# Patient Record
Sex: Female | Born: 1975 | Race: White | Hispanic: No | Marital: Single | Smoking: Never smoker
Health system: Southern US, Community
[De-identification: ages and names within clinical notes are randomized; demographics above are authoritative.]

## PROBLEM LIST (undated history)

## (undated) DIAGNOSIS — F70 Mild intellectual disabilities: Secondary | ICD-10-CM

## (undated) DIAGNOSIS — R413 Other amnesia: Secondary | ICD-10-CM

## (undated) DIAGNOSIS — I639 Cerebral infarction, unspecified: Secondary | ICD-10-CM

## (undated) DIAGNOSIS — E109 Type 1 diabetes mellitus without complications: Secondary | ICD-10-CM

## (undated) DIAGNOSIS — R4789 Other speech disturbances: Secondary | ICD-10-CM

---

## 2001-06-24 ENCOUNTER — Other Ambulatory Visit: Admission: RE | Admit: 2001-06-24 | Discharge: 2001-06-24 | Payer: Self-pay | Admitting: Obstetrics and Gynecology

## 2002-06-28 ENCOUNTER — Other Ambulatory Visit: Admission: RE | Admit: 2002-06-28 | Discharge: 2002-06-28 | Payer: Self-pay | Admitting: Obstetrics and Gynecology

## 2002-08-02 ENCOUNTER — Inpatient Hospital Stay (HOSPITAL_COMMUNITY): Admission: RE | Admit: 2002-08-02 | Discharge: 2002-08-04 | Payer: Self-pay | Admitting: Obstetrics and Gynecology

## 2003-05-27 HISTORY — PX: OTHER SURGICAL HISTORY: SHX169

## 2003-09-22 ENCOUNTER — Other Ambulatory Visit: Admission: RE | Admit: 2003-09-22 | Discharge: 2003-09-22 | Payer: Self-pay | Admitting: Obstetrics and Gynecology

## 2004-10-25 ENCOUNTER — Other Ambulatory Visit: Admission: RE | Admit: 2004-10-25 | Discharge: 2004-10-25 | Payer: Self-pay | Admitting: Obstetrics and Gynecology

## 2007-09-14 ENCOUNTER — Emergency Department (HOSPITAL_COMMUNITY): Admission: EM | Admit: 2007-09-14 | Discharge: 2007-09-14 | Payer: Self-pay | Admitting: Family Medicine

## 2011-02-18 LAB — POCT RAPID STREP A: Streptococcus, Group A Screen (Direct): NEGATIVE

## 2011-06-09 DIAGNOSIS — E049 Nontoxic goiter, unspecified: Secondary | ICD-10-CM | POA: Diagnosis not present

## 2011-06-09 DIAGNOSIS — E109 Type 1 diabetes mellitus without complications: Secondary | ICD-10-CM | POA: Diagnosis not present

## 2011-06-27 HISTORY — PX: IUD REMOVAL: SHX5392

## 2011-08-14 DIAGNOSIS — E109 Type 1 diabetes mellitus without complications: Secondary | ICD-10-CM | POA: Diagnosis not present

## 2011-08-14 DIAGNOSIS — H538 Other visual disturbances: Secondary | ICD-10-CM | POA: Diagnosis not present

## 2011-08-14 DIAGNOSIS — R51 Headache: Secondary | ICD-10-CM | POA: Diagnosis not present

## 2011-08-14 DIAGNOSIS — H5200 Hypermetropia, unspecified eye: Secondary | ICD-10-CM | POA: Diagnosis not present

## 2011-08-21 DIAGNOSIS — Z0000 Encounter for general adult medical examination without abnormal findings: Secondary | ICD-10-CM | POA: Diagnosis not present

## 2011-08-21 DIAGNOSIS — Z01419 Encounter for gynecological examination (general) (routine) without abnormal findings: Secondary | ICD-10-CM | POA: Diagnosis not present

## 2011-08-21 DIAGNOSIS — Z124 Encounter for screening for malignant neoplasm of cervix: Secondary | ICD-10-CM | POA: Diagnosis not present

## 2011-08-21 DIAGNOSIS — N760 Acute vaginitis: Secondary | ICD-10-CM | POA: Diagnosis not present

## 2011-08-21 DIAGNOSIS — Z30432 Encounter for removal of intrauterine contraceptive device: Secondary | ICD-10-CM | POA: Diagnosis not present

## 2011-09-01 DIAGNOSIS — IMO0001 Reserved for inherently not codable concepts without codable children: Secondary | ICD-10-CM | POA: Diagnosis not present

## 2011-09-01 DIAGNOSIS — R0602 Shortness of breath: Secondary | ICD-10-CM | POA: Diagnosis not present

## 2011-09-01 DIAGNOSIS — M2550 Pain in unspecified joint: Secondary | ICD-10-CM | POA: Diagnosis not present

## 2011-09-01 DIAGNOSIS — E109 Type 1 diabetes mellitus without complications: Secondary | ICD-10-CM | POA: Diagnosis not present

## 2011-09-15 DIAGNOSIS — N3000 Acute cystitis without hematuria: Secondary | ICD-10-CM | POA: Diagnosis not present

## 2011-09-15 DIAGNOSIS — N361 Urethral diverticulum: Secondary | ICD-10-CM | POA: Diagnosis not present

## 2011-09-16 ENCOUNTER — Other Ambulatory Visit: Payer: Self-pay | Admitting: Urology

## 2011-09-25 ENCOUNTER — Encounter (HOSPITAL_BASED_OUTPATIENT_CLINIC_OR_DEPARTMENT_OTHER): Payer: Self-pay | Admitting: *Deleted

## 2011-09-25 NOTE — Progress Notes (Addendum)
SPOKE W/ MOTHER, JO MARTIN, SHE IS LEGAL GUARDIAN.  NPO AFTER MN. ARRIVES AT 0915. NEEDS ISTAT AND URINE PREG.  MOTHER NEEDS TO COME BACK WITH PT DUE TO MILD MENTAL RETARDATION, UNABLE TO UNDERSTAND TO MUCH INFORMATION AT ONE TIME. COGNITIVELY AT 3RD GRADE LEVEL.  BUT, MOTHER STATES PT VERY SOCIAL AND INDEPENDENT.

## 2011-09-29 ENCOUNTER — Encounter (HOSPITAL_BASED_OUTPATIENT_CLINIC_OR_DEPARTMENT_OTHER): Payer: Self-pay | Admitting: Anesthesiology

## 2011-09-29 ENCOUNTER — Encounter (HOSPITAL_BASED_OUTPATIENT_CLINIC_OR_DEPARTMENT_OTHER): Payer: Self-pay | Admitting: *Deleted

## 2011-09-29 ENCOUNTER — Ambulatory Visit (HOSPITAL_BASED_OUTPATIENT_CLINIC_OR_DEPARTMENT_OTHER)
Admission: RE | Admit: 2011-09-29 | Discharge: 2011-09-29 | Disposition: A | Discharge status: Home / Self Care | Payer: Medicare Other | Source: Ambulatory Visit | Attending: Urology | Admitting: Urology

## 2011-09-29 ENCOUNTER — Ambulatory Visit (HOSPITAL_BASED_OUTPATIENT_CLINIC_OR_DEPARTMENT_OTHER): Payer: Medicare Other | Admitting: Anesthesiology

## 2011-09-29 ENCOUNTER — Encounter (HOSPITAL_BASED_OUTPATIENT_CLINIC_OR_DEPARTMENT_OTHER)
Admission: RE | Disposition: A | Discharge status: Home / Self Care | Payer: Self-pay | Source: Ambulatory Visit | Attending: Urology

## 2011-09-29 DIAGNOSIS — N368 Other specified disorders of urethra: Secondary | ICD-10-CM | POA: Diagnosis not present

## 2011-09-29 DIAGNOSIS — N361 Urethral diverticulum: Secondary | ICD-10-CM

## 2011-09-29 DIAGNOSIS — F70 Mild intellectual disabilities: Secondary | ICD-10-CM | POA: Insufficient documentation

## 2011-09-29 DIAGNOSIS — Z794 Long term (current) use of insulin: Secondary | ICD-10-CM | POA: Diagnosis not present

## 2011-09-29 DIAGNOSIS — E119 Type 2 diabetes mellitus without complications: Secondary | ICD-10-CM | POA: Diagnosis not present

## 2011-09-29 DIAGNOSIS — N898 Other specified noninflammatory disorders of vagina: Secondary | ICD-10-CM | POA: Insufficient documentation

## 2011-09-29 DIAGNOSIS — N809 Endometriosis, unspecified: Secondary | ICD-10-CM | POA: Diagnosis not present

## 2011-09-29 DIAGNOSIS — N398 Other specified disorders of urinary system: Secondary | ICD-10-CM | POA: Diagnosis not present

## 2011-09-29 HISTORY — DX: Type 1 diabetes mellitus without complications: E10.9

## 2011-09-29 HISTORY — DX: Other speech disturbances: R47.89

## 2011-09-29 HISTORY — PX: URETHRAL DIVERTICULECTOMY: SHX2618

## 2011-09-29 HISTORY — DX: Other amnesia: R41.3

## 2011-09-29 HISTORY — DX: Mild intellectual disabilities: F70

## 2011-09-29 HISTORY — DX: Cerebral infarction, unspecified: I63.9

## 2011-09-29 LAB — POCT PREGNANCY, URINE: Preg Test, Ur: NEGATIVE

## 2011-09-29 LAB — GLUCOSE, CAPILLARY: Glucose-Capillary: 119 mg/dL — ABNORMAL HIGH (ref 70–99)

## 2011-09-29 LAB — POCT I-STAT 4, (NA,K, GLUC, HGB,HCT)
Glucose, Bld: 80 mg/dL (ref 70–99)
HCT: 45 % (ref 36.0–46.0)
Hemoglobin: 15.3 g/dL — ABNORMAL HIGH (ref 12.0–15.0)
Potassium: 4.2 mEq/L (ref 3.5–5.1)
Sodium: 142 mEq/L (ref 135–145)

## 2011-09-29 SURGERY — EXCISION, DIVERTICULUM, URETHRA
Anesthesia: General | Site: Urethra | Wound class: Clean Contaminated

## 2011-09-29 MED ORDER — MIDAZOLAM HCL 5 MG/5ML IJ SOLN
INTRAMUSCULAR | Status: DC | PRN
  Administered 2011-09-29: 1 mg via INTRAVENOUS

## 2011-09-29 MED ORDER — LIDOCAINE HCL (CARDIAC) 20 MG/ML IV SOLN
INTRAVENOUS | Status: DC | PRN
  Administered 2011-09-29: 60 mg via INTRAVENOUS

## 2011-09-29 MED ORDER — BUPIVACAINE-EPINEPHRINE 0.5% -1:200000 IJ SOLN: INTRAMUSCULAR | Status: DC | PRN

## 2011-09-29 MED ORDER — PROPOFOL 10 MG/ML IV EMUL
INTRAVENOUS | Status: DC | PRN
  Administered 2011-09-29: 200 mg via INTRAVENOUS

## 2011-09-29 MED ORDER — STERILE WATER FOR IRRIGATION IR SOLN
Status: DC | PRN
  Administered 2011-09-29: 500 mL

## 2011-09-29 MED ORDER — HYDROCODONE-ACETAMINOPHEN 10-325 MG PO TABS: 1.0000 | ORAL_TABLET | ORAL | Status: AC | PRN

## 2011-09-29 MED ORDER — LACTATED RINGERS IV SOLN
INTRAVENOUS | Status: DC
  Administered 2011-09-29: 100 mL/h via INTRAVENOUS
  Administered 2011-09-29 (×2): via INTRAVENOUS

## 2011-09-29 MED ORDER — BUPIVACAINE-EPINEPHRINE 0.25% -1:200000 IJ SOLN
INTRAMUSCULAR | Status: DC | PRN
  Administered 2011-09-29: 7 mL

## 2011-09-29 MED ORDER — FENTANYL CITRATE 0.05 MG/ML IJ SOLN: 25.0000 ug | INTRAMUSCULAR | Status: DC | PRN

## 2011-09-29 MED ORDER — BACITRACIN-NEOMYCIN-POLYMYXIN 400-5-5000 EX OINT
TOPICAL_OINTMENT | CUTANEOUS | Status: DC | PRN
  Administered 2011-09-29: 1 via TOPICAL

## 2011-09-29 MED ORDER — ACETAMINOPHEN 10 MG/ML IV SOLN
INTRAVENOUS | Status: DC | PRN
  Administered 2011-09-29: 1000 mg via INTRAVENOUS

## 2011-09-29 MED ORDER — FENTANYL CITRATE 0.05 MG/ML IJ SOLN
INTRAMUSCULAR | Status: DC | PRN
  Administered 2011-09-29 (×4): 50 ug via INTRAVENOUS

## 2011-09-29 MED ORDER — MEPERIDINE HCL 25 MG/ML IJ SOLN: 6.2500 mg | INTRAMUSCULAR | Status: DC | PRN

## 2011-09-29 MED ORDER — CIPROFLOXACIN HCL 500 MG PO TABS: 500.0000 mg | ORAL_TABLET | Freq: Two times a day (BID) | ORAL | Status: AC

## 2011-09-29 MED ORDER — LACTATED RINGERS IV SOLN: INTRAVENOUS | Status: DC

## 2011-09-29 MED ORDER — PROMETHAZINE HCL 25 MG/ML IJ SOLN
6.2500 mg | INTRAMUSCULAR | Status: AC | PRN
  Administered 2011-09-29 (×2): 6.25 mg via INTRAVENOUS

## 2011-09-29 MED ORDER — CIPROFLOXACIN IN D5W 400 MG/200ML IV SOLN
400.0000 mg | INTRAVENOUS | Status: AC
  Administered 2011-09-29: 400 mg via INTRAVENOUS

## 2011-09-29 MED ORDER — ONDANSETRON HCL 4 MG/2ML IJ SOLN
INTRAMUSCULAR | Status: DC | PRN
  Administered 2011-09-29: 4 mg via INTRAVENOUS

## 2011-09-29 SURGICAL SUPPLY — 49 items
BAG DRN ANRFLXCHMBR STRAP LEK (BAG)
BAG URINE DRAINAGE (UROLOGICAL SUPPLIES) IMPLANT
BAG URINE LEG 19OZ MD ST LTX (BAG) IMPLANT
BLADE SURG 15 STRL LF DISP TIS (BLADE) ×1 IMPLANT
BLADE SURG 15 STRL SS (BLADE) ×2
BLADE SURG ROTATE 9660 (MISCELLANEOUS) ×1 IMPLANT
CATH FOLEY 2WAY SLVR  5CC 16FR (CATHETERS) ×1
CATH FOLEY 2WAY SLVR 5CC 16FR (CATHETERS) ×1 IMPLANT
CATH FOLEY 5CC 28FR (CATHETERS) IMPLANT
CLOTH BEACON ORANGE TIMEOUT ST (SAFETY) ×2 IMPLANT
COVER MAYO STAND STRL (DRAPES) ×1 IMPLANT
COVER TABLE BACK 60X90 (DRAPES) ×1 IMPLANT
DRAPE LG THREE QUARTER DISP (DRAPES) ×3 IMPLANT
DRAPE STERI URO 23X35 APER SZ5 (DRAPE) ×1 IMPLANT
ELECT REM PT RETURN 9FT ADLT (ELECTROSURGICAL) ×2
ELECTRODE REM PT RTRN 9FT ADLT (ELECTROSURGICAL) ×1 IMPLANT
GAUZE PACKING IODOFORM 2 (PACKING) ×1 IMPLANT
GAUZE SPONGE 4X4 16PLY XRAY LF (GAUZE/BANDAGES/DRESSINGS) ×1 IMPLANT
GLOVE BIO SURGEON STRL SZ8 (GLOVE) ×2 IMPLANT
GOWN PREVENTION PLUS LG XLONG (DISPOSABLE) ×2 IMPLANT
GOWN STRL REIN XL XLG (GOWN DISPOSABLE) ×2 IMPLANT
IV NS IRRIG 3000ML ARTHROMATIC (IV SOLUTION) IMPLANT
LEGGING LITHOTOMY PAIR STRL (DRAPES) ×1 IMPLANT
NEEDLE HYPO 22GX1.5 SAFETY (NEEDLE) ×1 IMPLANT
NS IRRIG 500ML POUR BTL (IV SOLUTION) IMPLANT
PACK BASIN DAY SURGERY FS (CUSTOM PROCEDURE TRAY) ×2 IMPLANT
PACK CYSTOSCOPY (CUSTOM PROCEDURE TRAY) ×1 IMPLANT
PENCIL BUTTON HOLSTER BLD 10FT (ELECTRODE) ×2 IMPLANT
PLUG CATH AND CAP STER (CATHETERS) ×2 IMPLANT
RETRACTOR LONRSTAR 16.6X16.6CM (MISCELLANEOUS) IMPLANT
RETRACTOR STAY HOOK 5MM (MISCELLANEOUS) ×1 IMPLANT
RETRACTOR STER APS 16.6X16.6CM (MISCELLANEOUS) ×2
SUCTION FRAZIER TIP 10 FR DISP (SUCTIONS) ×1 IMPLANT
SUT CHROMIC 4 0 RB 1X27 (SUTURE) ×2 IMPLANT
SUT ETHIBOND 0 (SUTURE) IMPLANT
SUT VIC AB 2-0 SH 27 (SUTURE) ×2
SUT VIC AB 2-0 SH 27XBRD (SUTURE) ×1 IMPLANT
SUT VIC AB 2-0 UR6 27 (SUTURE) ×2 IMPLANT
SUT VIC AB 3-0 SH 27 (SUTURE) ×2
SUT VIC AB 3-0 SH 27X BRD (SUTURE) ×1 IMPLANT
SUT VICRYL 3 0 UR 6 27 (SUTURE) ×2 IMPLANT
SWAB CULTURE LIQ STUART DBL (MISCELLANEOUS) ×1 IMPLANT
SYR BULB IRRIGATION 50ML (SYRINGE) ×2 IMPLANT
SYRINGE 10CC LL (SYRINGE) ×2 IMPLANT
TUBE ANAEROBIC SPECIMEN COL (MISCELLANEOUS) IMPLANT
TUBE CONNECTING 12X1/4 (SUCTIONS) ×2 IMPLANT
WATER STERILE IRR 3000ML UROMA (IV SOLUTION) ×1 IMPLANT
WATER STERILE IRR 500ML POUR (IV SOLUTION) ×2 IMPLANT
YANKAUER SUCT BULB TIP NO VENT (SUCTIONS) ×2 IMPLANT

## 2011-09-29 NOTE — Anesthesia Postprocedure Evaluation (Signed)
  Anesthesia Post-op Note  Patient: Ashley Valdez  Procedure(s) Performed: Procedure(s) (LRB): URETHRAL DIVERTICULECTOMY (N/A)  Patient Location: PACU  Anesthesia Type: General  Level of Consciousness: awake and alert   Airway and Oxygen Therapy: Patient Spontanous Breathing  Post-op Pain: mild  Post-op Assessment: Post-op Vital signs reviewed, Patient's Cardiovascular Status Stable, Respiratory Function Stable, Patent Airway and No signs of Nausea or vomiting  Post-op Vital Signs: stable  Complications: No apparent anesthesia complications

## 2011-09-29 NOTE — Progress Notes (Signed)
Patient's BP 89/58. Patients denies any pain or nausea. Is taking saltines and Coke. Notified Dr. Council Mechanic. Will continue to monitor BP.

## 2011-09-29 NOTE — Anesthesia Preprocedure Evaluation (Signed)
Anesthesia Evaluation  Patient identified by MRN, date of birth, ID band Patient awake    Reviewed: Allergy & Precautions, H&P , NPO status , Patient's Chart, lab work & pertinent test results  Airway Mallampati: II TM Distance: >3 FB Neck ROM: Full    Dental No notable dental hx.    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) negative neurological ROS  negative psych ROS   GI/Hepatic negative GI ROS, Neg liver ROS,   Endo/Other  negative endocrine ROSDiabetes mellitus-, Type 1  Renal/GU negative Renal ROS  negative genitourinary   Musculoskeletal negative musculoskeletal ROS (+)   Abdominal   Peds negative pediatric ROS (+)  Hematology negative hematology ROS (+)   Anesthesia Other Findings   Reproductive/Obstetrics negative OB ROS                           Anesthesia Physical Anesthesia Plan  ASA: II  Anesthesia Plan: General   Post-op Pain Management:    Induction: Intravenous  Airway Management Planned:   Additional Equipment:   Intra-op Plan:   Post-operative Plan: Extubation in OR  Informed Consent: I have reviewed the patients History and Physical, chart, labs and discussed the procedure including the risks, benefits and alternatives for the proposed anesthesia with the patient or authorized representative who has indicated his/her understanding and acceptance.   Dental advisory given  Plan Discussed with: CRNA  Anesthesia Plan Comments:         Anesthesia Quick Evaluation

## 2011-09-29 NOTE — Discharge Instructions (Addendum)
Post vaginal surgery  Catheter care:  You may or may not go home with a catheter or tube in your bladder. If you or urinating normally, you will probably not need a tube. If you're not emptying normally, some form of drainage is needed. The options include a catheter from the abdomen (called SP tube), a catheter from the urethra, or a self-catheterization routine at timed intervals. These will be discussed with you before your discharge. The type depends on your individual case and preferences. Ask [place] if you have any questions about the catheter management.  There is vaginal packing in place it is a single, long piece of material with antibiotic ointment on it. This may be removed 24 hours after the surgery by pulling on the portion of the material at the opening of the vagina.  Diet:  You may return to your normal diet within 24 hours following your surgery. You may note some mild nausea and possibly vomiting the first 6-8 hours following surgery. This is usually due to the side effects of anesthesia, and will disappear quite soon. I would suggest clear liquids and a very light meal the first evening following your surgery.  Activity:  Your physical activity is to be restricted, especially during the first 2 weeks home. During this time use the following guidelines:   No lifting heavy objects (anything greater than 10 pounds).  No driving a car and limit long car rides.  No strenuous exercise, limits stairclimbing to a minimum.  Bowels:  You may need a stool softener and. A bowel movement every other day is reasonable. Use a mild laxative if needed, such as milk of magnesia 2-3 tablespoons, or 2 Dulcolax tablets. Call if you continue to have problems. If you had been taking narcotics for pain, before, during or after your surgery, you may be constipated. Take a laxative if necessary.  Medication:  You should resume your pre-surgery medications unless told not to. In addition you may be  given an antibiotic to prevent or treat infection. Antibiotics are not always necessary. Pain pills (Tylox or Vicodin) may also be given to help with the incision and catheter discomfort. Tylenol (acetaminophen) or Advil (ibuprofen) which have no narcotics Arbetter if the pain is not too bad. All medication should be taken as prescribed until the bottles are finished unless you are having an unusual reaction to one of the drugs.  Problems you should report to [place]:  a. Fever greater than 101F. b. Heavy bleeding, or clots (see notes above about blood in urine). c. Inability to urinate. d. Drug reactions (hives, rash, nausea, vomiting, diarrhea). e. Severe burning or pain with urination that is not improving.HOME CARE INSTRUCTIONS FOR SCROTAL PROCEDURES  Wound Care & Hygiene: You may apply an ice bag to the scrotum.  This may help decrease swelling.  You may have a dressing held in place by an athletic supporter.  You may remove the dressing in 24 hours and shower in 48 hours.  Continue to use the athletic supporter until your follow-up visit with your doctor.  Activity: Rest today - not necessarily flat bed rest.  Just take it easy.  You should not do strenuous activities until your follow-up visit with your doctor.  You may resume light activity in 48 hours.  Return to Work:  Your doctor will advise you of this depending on the type of work you do  Diet: Drink liquids or eat a light diet this evening.  You may resume a regular  diet tomorrow.  General Expectations: You may have a small amount of bleeding.  The scrotum may be swollen or bruised for about a week.  Call your Doctor if these occur:  -persistent or heavy bleeding  -temperature of 101 degrees or more  -severe pain, not relieved by your pain medication  Return to Doctor's Office:  *** Call to set up and appointment.  Patient Signature:  __________________________________________________  Nurse's Signature:   __________________________________________________

## 2011-09-29 NOTE — Op Note (Signed)
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 Cystoscopy 2. Excision of anterior vaginal wall cyst  SURGEON:  Garnett Farm  INDICATION: Ashley Valdez is a 36 year old female who was found by her gynecologist to have a suburethral cystic mass in the midline. The patient had reported some mild dysuria however her urinalysis was negative. The patient reported a urine culture was positive and she was treated with antibiotics but she does not have a history of recurrent UTIs or other past urologic history. We therefore discussed evaluation of urethra cystoscopically and excision of the mass with closure of any communication with the urethra if present.  ANESTHESIA:  General  EBL:  Minimal  DRAINS: None  LOCAL MEDICATIONS USED:  1/4% Marcaine with epinephrine  SPECIMEN:  1. Cyst fluid for culture. 2. Anterior vaginal wall cyst to pathology  Description of procedure: After informed consent the patient was brought to the major OR, placed on the table and administered general anesthesia. She was then moved to the dorsal lithotomy position and her genitalia was sterilely prepped and draped. An official timeout was then performed.  I initially proceeded with cystoscopy using a 22 French rigid scope with 12 lens. The scope was passed under direct vision through the urethra and no defect in the urethral floor could be identified. There was also no worrisome erythema or inflammation. The bladder was then entered and fully inspected. It was noted be free of any tumor stones or inflammatory lesions. Ureteral orifices were of normal configuration and position. I then withdrew the scope and applied pressure to the cystic lesion but noted no purulent material within the urethra.  The cystoscope was removed and a 16 French Foley catheter was placed in the  bladder. A weighted retractor was placed in the vagina and I then injected Marcaine with epinephrine around the cyst and back along the anterior vaginal wall in an inverted U pattern. I then made an incision just below the urethra at the introitus and dissected down toward the cystic structure using blunt and sharp technique with the Strully scissors. I was able to isolate the cystic mass from the urethra and it appeared to have no communication with the urethra. As the cystic lesion was being dissected material appeared to be expressed from the cyst and it appeared gelatinous and nonpurulent. It was cultured however. I then was able to excise the tissue from around the cyst and as it was intimately associated with the anterior vaginal wall and dissection from the anterior vaginal wall would have left very thin vaginal mucosa that would likely not have healed properly I excised posterior to the lesion completely removing the lesion.   I then repeated cystoscopy in an identical fashion and found no defects in the urethra or evidence of previous communication with the cystic lesion. I therefore reinserted the Foley catheter.  The vaginal mucosa was then dissected laterally and in the midline to mobilize the U-shaped flap of normal appearing vaginal mucosa. I irrigated the incision with saline and then reapproximated the vaginal mucosa initially in the midline with a single 3-0 Vicryl suture. I then went to the posterior aspects of the U incision and closed the incision from posterior to anterior in a running fashion with 3-0 Vicryl. The flap was noted to have no tension and was thick and viable. I therefore removed the Foley catheter and used 2  inch iodoform gauze with triple antibiotic ointment as vaginal packing. The patient was then awakened and taken to the recovery room in stable and satisfactory condition. She tolerated the procedure well and there were no intraoperative complications. Needle sponge and  instrument counts were correct x2.    PLAN OF CARE: Discharge to home after PACU  PATIENT DISPOSITION:  PACU - hemodynamically stable.

## 2011-09-29 NOTE — Anesthesia Procedure Notes (Signed)
Procedure Name: LMA Insertion Date/Time: 09/29/2011 10:36 AM Performed by: Fran Lowes Pre-anesthesia Checklist: Patient identified, Emergency Drugs available, Suction available and Patient being monitored Patient Re-evaluated:Patient Re-evaluated prior to inductionOxygen Delivery Method: Circle System Utilized Preoxygenation: Pre-oxygenation with 100% oxygen Intubation Type: IV induction Ventilation: Mask ventilation without difficulty LMA: LMA with gastric port inserted LMA Size: 4.0 Number of attempts: 1 Placement Confirmation: positive ETCO2 Tube secured with: Tape Dental Injury: Teeth and Oropharynx as per pre-operative assessment

## 2011-09-29 NOTE — H&P (Signed)
History of Present Illness        Ashley Valdez is a 36 year old female patient of Dr. Ellyn Hack who was seen in office consultation for further evaluation of a urethral cyst. This was found on routine gynecologic examination. The patient had reported dysuria. Her urinalysis was unremarkable however urine culture was performed and the patient reports infection was found and she was treated with antibiotics. She does not have a history of previous UTIs or hematuria. She does not have any prior urologic history. Her urethral cyst is of mild severity with no modifying factors. The duration that it has been present is unknown.   Past Medical History Problems  1. History of  Diabetes Mellitus 250.00 2. History of  Endometriosis 617.9 3. History of  Mild Mental Retardation 317  Surgical History Problems  1. History of  Exploratory Laparotomy 2. History of  Oophorectomy Unilateral Right Side  Current Meds 1. NovoLIN N 100 UNIT/ML Subcutaneous Suspension; Therapy: 31Mar2013 to  Allergies Medication  1. No Known Drug Allergies  Family History Problems  1. Family history of  Malignant Breast Neoplasm V16.3 2. Family history of  Prostate Cancer V16.42  Social History Problems    Marital History - Single   Never A Smoker Denied    History of  Alcohol Use   History of  Caffeine Use  Review of Systems Genitourinary, constitutional, skin, eye, otolaryngeal, hematologic/lymphatic, cardiovascular, pulmonary, endocrine, musculoskeletal, gastrointestinal, neurological and psychiatric system(s) were reviewed and pertinent findings if present are noted.  Genitourinary: dysuria.  Gastrointestinal: abdominal pain.  Musculoskeletal: back pain.  Neurological: headache.    Vitals Vital Signs  BMI Calculated: 28.32 BSA Calculated: 1.67 Height: 5 ft 1 in Weight: 150 lb  Blood Pressure: 118 / 66 Heart Rate: 105  Physical Exam Constitutional: Well nourished and well developed . No acute distress.    ENT:. The ears and nose are normal in appearance.  Neck: The appearance of the neck is normal and no neck mass is present.  Pulmonary: No respiratory distress and normal respiratory rhythm and effort.  Cardiovascular: Heart rate and rhythm are normal . No peripheral edema.  Abdomen: The abdomen is soft and nontender. No masses are palpated. No CVA tenderness. No hernias are palpable. No hepatosplenomegaly noted.  Genitourinary: Examination of the external genitalia shows normal female external genitalia and no lesions. The urethra is normal in appearance and not tender. There is no urethral mass. Vaginal exam demonstrates a mass (distal and anterior), but no abnormalities. No cystocele is identified. No enterocele is identified. No rectocele is identified. The adnexa are palpably normal. The bladder is non tender and not distended. The anus is normal on inspection. The perineum is normal on inspection.  Lymphatics: The femoral and inguinal nodes are not enlarged or tender.  Skin: Normal skin turgor, no visible rash and no visible skin lesions.  Neuro/Psych:. Mood and affect are appropriate.    Results/Data Urine    COLOR YELLOW   APPEARANCE CLEAR   SPECIFIC GRAVITY 1.010   pH 6.0   GLUCOSE NEG mg/dL  BILIRUBIN NEG   KETONE NEG mg/dL  BLOOD TRACE   PROTEIN NEG mg/dL  UROBILINOGEN 0.2 mg/dL  NITRITE NEG   LEUKOCYTE ESTERASE MOD   SQUAMOUS EPITHELIAL/HPF MODERATE   WBC 4-6 WBC/hpf  RBC 0-3 RBC/hpf  BACTERIA FEW   CRYSTALS NONE SEEN   CASTS NONE SEEN    Old records or history reviewed: Notes from Dr. Emeline Darling office as above.  The following clinical lab reports  were reviewed:  urinalysis as above.    Assessment Assessed  1. 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 Palpation did not result in any purulent material that could be seen at the urethral  meatus. It's character and location is most consistent with a urethral diverticulum. I therefore have discussed with the patient the treatment for this which is surgical in nature. I've gone over the procedure of a urethral diverticulectomy including the incision used, the risks and complications and the need for a catheter after the procedure. I also would like to obtain her urine culture results.   Plan Urethral Diverticulum (599.2)     1. Obtain urine culture results. 2. She is scheduled for an outpatient urethral diverticulectomy and cystoscopy today.

## 2011-09-29 NOTE — Transfer of Care (Signed)
Immediate Anesthesia Transfer of Care Note  Patient: Ashley Valdez  Procedure(s) Performed: Procedure(s) (LRB): URETHRAL DIVERTICULECTOMY (N/A)  Patient Location: PACU  Anesthesia Type: General  Level of Consciousness: awake and oriented  Airway & Oxygen Therapy: Patient Spontanous Breathing and Patient connected to nasal cannula oxygen  Post-op Assessment: Post -op Vital signs reviewed and stable  Post vital signs:reviewed and stable   Complications: No apparent anesthesia complications

## 2011-09-30 ENCOUNTER — Encounter (HOSPITAL_BASED_OUTPATIENT_CLINIC_OR_DEPARTMENT_OTHER): Payer: Self-pay | Admitting: Urology

## 2011-10-01 LAB — WOUND CULTURE
Culture: NO GROWTH
Gram Stain: NONE SEEN

## 2011-10-07 DIAGNOSIS — N898 Other specified noninflammatory disorders of vagina: Secondary | ICD-10-CM | POA: Diagnosis not present

## 2011-10-16 DIAGNOSIS — G5600 Carpal tunnel syndrome, unspecified upper limb: Secondary | ICD-10-CM | POA: Diagnosis not present

## 2011-10-16 DIAGNOSIS — E109 Type 1 diabetes mellitus without complications: Secondary | ICD-10-CM | POA: Diagnosis not present

## 2011-10-16 DIAGNOSIS — E049 Nontoxic goiter, unspecified: Secondary | ICD-10-CM | POA: Diagnosis not present

## 2011-11-21 DIAGNOSIS — Z3009 Encounter for other general counseling and advice on contraception: Secondary | ICD-10-CM | POA: Diagnosis not present

## 2011-11-21 DIAGNOSIS — Z3049 Encounter for surveillance of other contraceptives: Secondary | ICD-10-CM | POA: Diagnosis not present

## 2012-02-20 ENCOUNTER — Ambulatory Visit (HOSPITAL_COMMUNITY)
Admission: RE | Admit: 2012-02-20 | Discharge: 2012-02-20 | Disposition: A | Discharge status: Home / Self Care | Payer: Medicare Other | Source: Ambulatory Visit | Attending: Endocrinology | Admitting: Endocrinology

## 2012-02-20 ENCOUNTER — Other Ambulatory Visit (HOSPITAL_COMMUNITY): Payer: Self-pay | Admitting: Endocrinology

## 2012-02-20 DIAGNOSIS — E109 Type 1 diabetes mellitus without complications: Secondary | ICD-10-CM | POA: Diagnosis not present

## 2012-02-20 DIAGNOSIS — R1031 Right lower quadrant pain: Secondary | ICD-10-CM | POA: Insufficient documentation

## 2012-02-20 DIAGNOSIS — Z23 Encounter for immunization: Secondary | ICD-10-CM | POA: Diagnosis not present

## 2012-02-20 DIAGNOSIS — R82998 Other abnormal findings in urine: Secondary | ICD-10-CM | POA: Diagnosis not present

## 2012-02-20 DIAGNOSIS — E049 Nontoxic goiter, unspecified: Secondary | ICD-10-CM | POA: Diagnosis not present

## 2012-02-24 DIAGNOSIS — N926 Irregular menstruation, unspecified: Secondary | ICD-10-CM | POA: Diagnosis not present

## 2012-06-10 DIAGNOSIS — R071 Chest pain on breathing: Secondary | ICD-10-CM | POA: Diagnosis not present

## 2012-06-29 DIAGNOSIS — R05 Cough: Secondary | ICD-10-CM | POA: Diagnosis not present

## 2012-06-29 DIAGNOSIS — E109 Type 1 diabetes mellitus without complications: Secondary | ICD-10-CM | POA: Diagnosis not present

## 2012-10-27 DIAGNOSIS — E049 Nontoxic goiter, unspecified: Secondary | ICD-10-CM | POA: Diagnosis not present

## 2012-10-27 DIAGNOSIS — Z6831 Body mass index (BMI) 31.0-31.9, adult: Secondary | ICD-10-CM | POA: Diagnosis not present

## 2012-10-27 DIAGNOSIS — E109 Type 1 diabetes mellitus without complications: Secondary | ICD-10-CM | POA: Diagnosis not present

## 2012-12-08 DIAGNOSIS — L259 Unspecified contact dermatitis, unspecified cause: Secondary | ICD-10-CM | POA: Diagnosis not present

## 2012-12-13 DIAGNOSIS — Z6832 Body mass index (BMI) 32.0-32.9, adult: Secondary | ICD-10-CM | POA: Diagnosis not present

## 2012-12-13 DIAGNOSIS — M25579 Pain in unspecified ankle and joints of unspecified foot: Secondary | ICD-10-CM | POA: Diagnosis not present

## 2012-12-13 DIAGNOSIS — E109 Type 1 diabetes mellitus without complications: Secondary | ICD-10-CM | POA: Diagnosis not present

## 2012-12-13 DIAGNOSIS — L259 Unspecified contact dermatitis, unspecified cause: Secondary | ICD-10-CM | POA: Diagnosis not present

## 2012-12-14 DIAGNOSIS — L259 Unspecified contact dermatitis, unspecified cause: Secondary | ICD-10-CM | POA: Diagnosis not present

## 2012-12-14 DIAGNOSIS — E109 Type 1 diabetes mellitus without complications: Secondary | ICD-10-CM | POA: Diagnosis not present

## 2012-12-14 DIAGNOSIS — M25579 Pain in unspecified ankle and joints of unspecified foot: Secondary | ICD-10-CM | POA: Diagnosis not present

## 2012-12-14 DIAGNOSIS — Z6832 Body mass index (BMI) 32.0-32.9, adult: Secondary | ICD-10-CM | POA: Diagnosis not present

## 2012-12-20 DIAGNOSIS — M76829 Posterior tibial tendinitis, unspecified leg: Secondary | ICD-10-CM | POA: Diagnosis not present

## 2013-02-14 DIAGNOSIS — M2550 Pain in unspecified joint: Secondary | ICD-10-CM | POA: Diagnosis not present

## 2013-02-14 DIAGNOSIS — M533 Sacrococcygeal disorders, not elsewhere classified: Secondary | ICD-10-CM | POA: Diagnosis not present

## 2013-02-14 DIAGNOSIS — R5381 Other malaise: Secondary | ICD-10-CM | POA: Diagnosis not present

## 2013-02-14 DIAGNOSIS — Z79899 Other long term (current) drug therapy: Secondary | ICD-10-CM | POA: Diagnosis not present

## 2013-02-14 DIAGNOSIS — M25579 Pain in unspecified ankle and joints of unspecified foot: Secondary | ICD-10-CM | POA: Diagnosis not present

## 2013-02-17 DIAGNOSIS — Z23 Encounter for immunization: Secondary | ICD-10-CM | POA: Diagnosis not present

## 2013-03-01 DIAGNOSIS — E109 Type 1 diabetes mellitus without complications: Secondary | ICD-10-CM | POA: Diagnosis not present

## 2013-03-01 DIAGNOSIS — E049 Nontoxic goiter, unspecified: Secondary | ICD-10-CM | POA: Diagnosis not present

## 2013-03-01 DIAGNOSIS — Z6832 Body mass index (BMI) 32.0-32.9, adult: Secondary | ICD-10-CM | POA: Diagnosis not present

## 2013-03-01 DIAGNOSIS — M79609 Pain in unspecified limb: Secondary | ICD-10-CM | POA: Diagnosis not present

## 2013-03-07 DIAGNOSIS — Z0000 Encounter for general adult medical examination without abnormal findings: Secondary | ICD-10-CM | POA: Diagnosis not present

## 2013-03-07 DIAGNOSIS — Z3049 Encounter for surveillance of other contraceptives: Secondary | ICD-10-CM | POA: Diagnosis not present

## 2013-03-07 DIAGNOSIS — Z01419 Encounter for gynecological examination (general) (routine) without abnormal findings: Secondary | ICD-10-CM | POA: Diagnosis not present

## 2013-03-11 DIAGNOSIS — M722 Plantar fascial fibromatosis: Secondary | ICD-10-CM | POA: Diagnosis not present

## 2013-03-11 DIAGNOSIS — M25579 Pain in unspecified ankle and joints of unspecified foot: Secondary | ICD-10-CM | POA: Diagnosis not present

## 2013-03-11 DIAGNOSIS — B079 Viral wart, unspecified: Secondary | ICD-10-CM | POA: Diagnosis not present

## 2013-06-13 DIAGNOSIS — M722 Plantar fascial fibromatosis: Secondary | ICD-10-CM | POA: Diagnosis not present

## 2013-06-13 DIAGNOSIS — M76829 Posterior tibial tendinitis, unspecified leg: Secondary | ICD-10-CM | POA: Diagnosis not present

## 2013-06-13 DIAGNOSIS — B079 Viral wart, unspecified: Secondary | ICD-10-CM | POA: Diagnosis not present

## 2013-06-13 DIAGNOSIS — M533 Sacrococcygeal disorders, not elsewhere classified: Secondary | ICD-10-CM | POA: Diagnosis not present

## 2013-06-29 DIAGNOSIS — E109 Type 1 diabetes mellitus without complications: Secondary | ICD-10-CM | POA: Diagnosis not present

## 2013-06-29 DIAGNOSIS — Z6833 Body mass index (BMI) 33.0-33.9, adult: Secondary | ICD-10-CM | POA: Diagnosis not present

## 2013-06-29 DIAGNOSIS — E049 Nontoxic goiter, unspecified: Secondary | ICD-10-CM | POA: Diagnosis not present

## 2013-06-29 DIAGNOSIS — M79609 Pain in unspecified limb: Secondary | ICD-10-CM | POA: Diagnosis not present

## 2013-07-02 IMAGING — US US RENAL
1 series · 14 of 25 positions shown · non-contrast
Comparison: None.

CLINICAL DATA: Right lower quadrant pain.

RENAL/URINARY TRACT ULTRASOUND COMPLETE

[Series 1: us renal · 0.26mm/px · 14 of 34 slices shown]
[im 1/34]
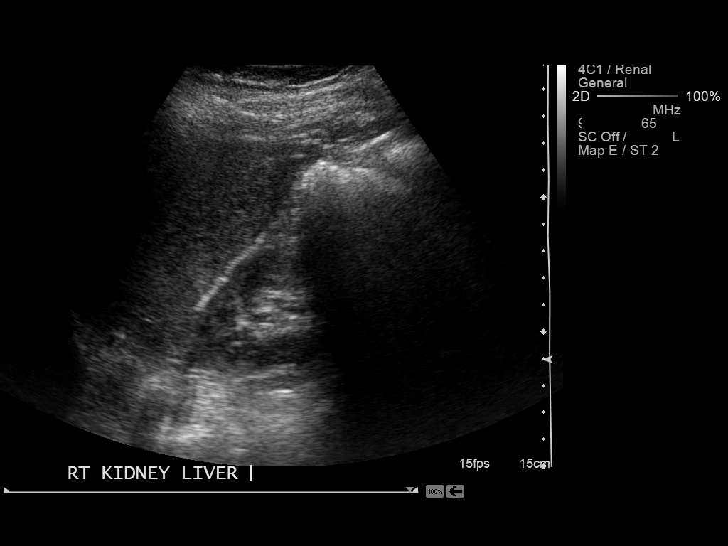
[im 3/34]
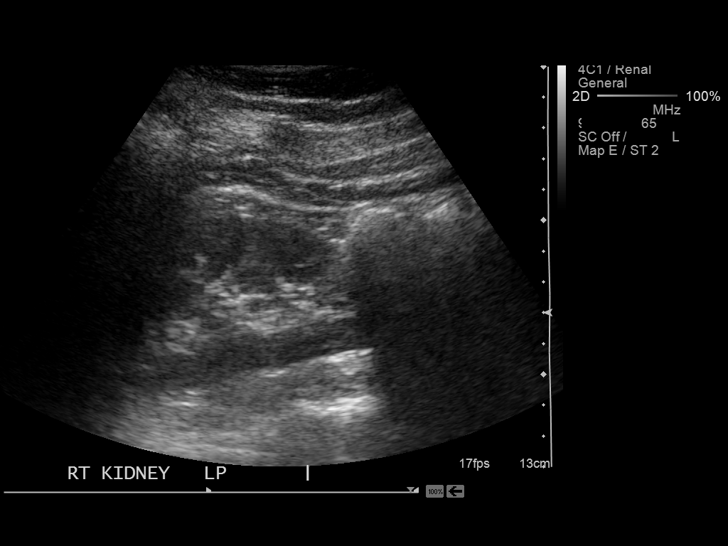
[im 6/34]
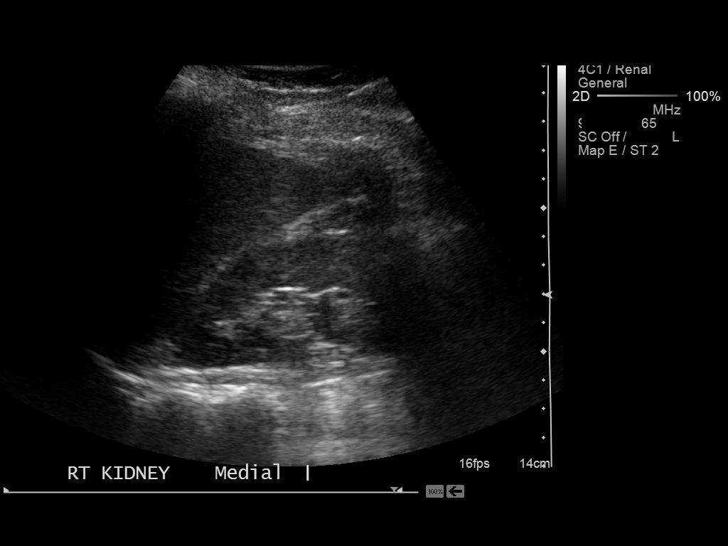
[im 9/34]
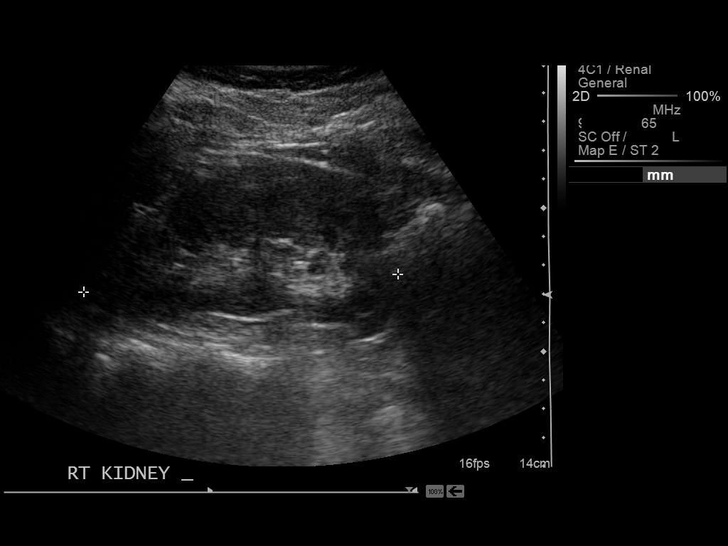
[im 12/34]
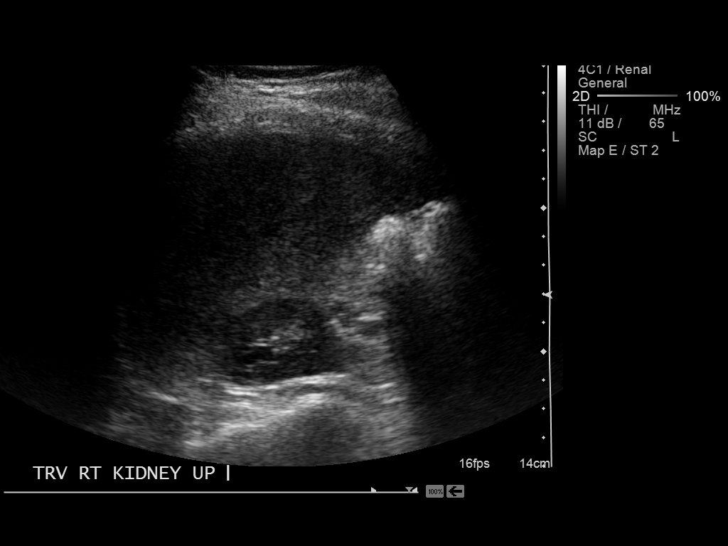
[im 13/34]
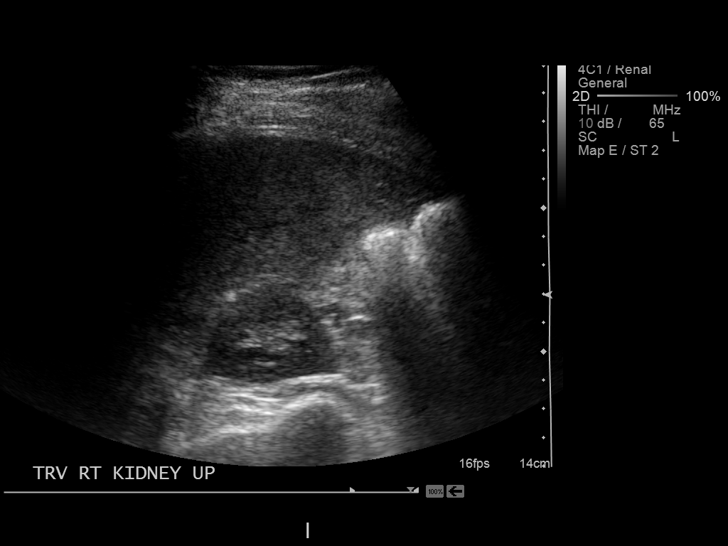
[im 16/34]
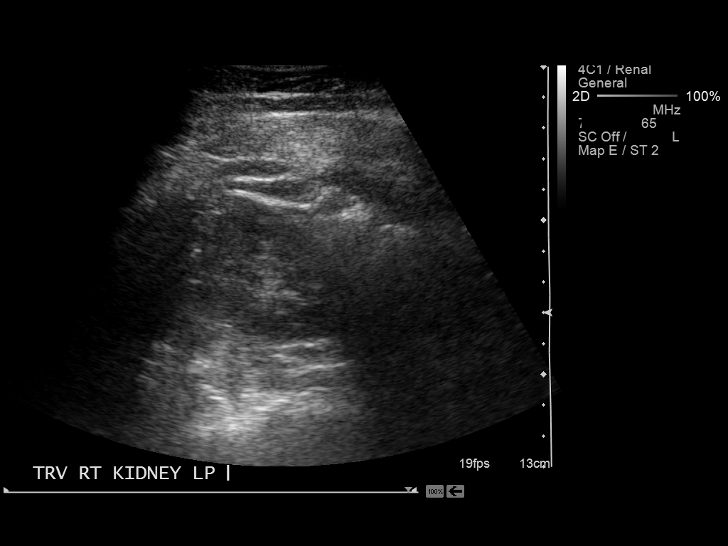
[im 18/34]
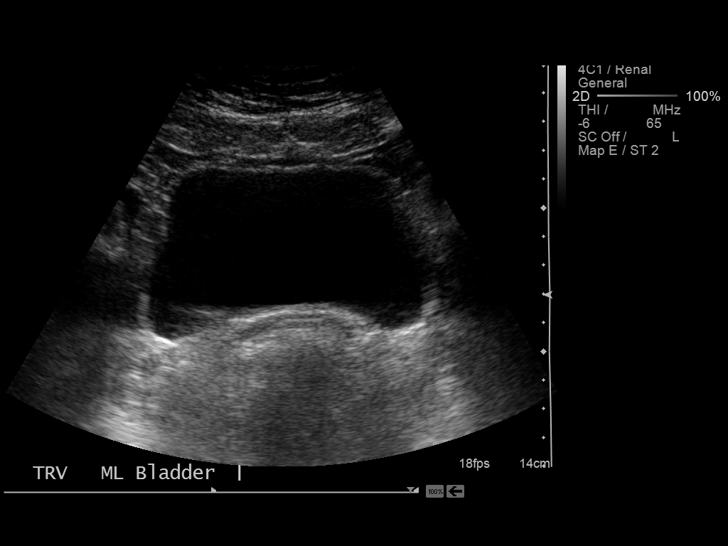
[im 21/34]
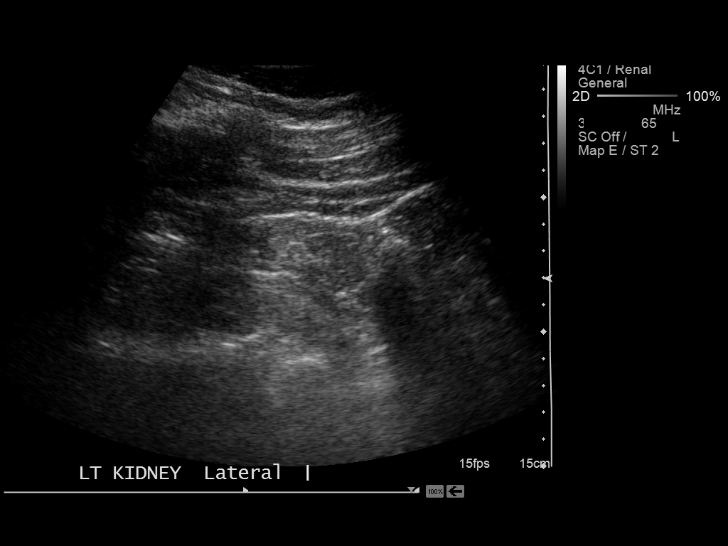
[im 23/34]
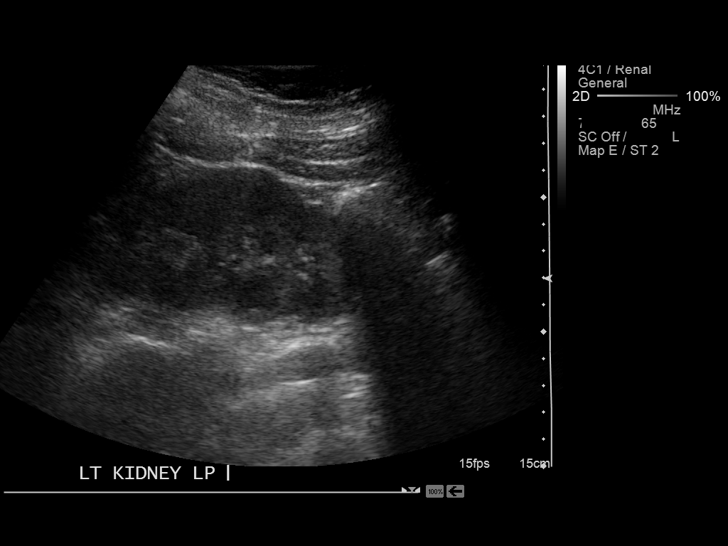
[im 25/34]
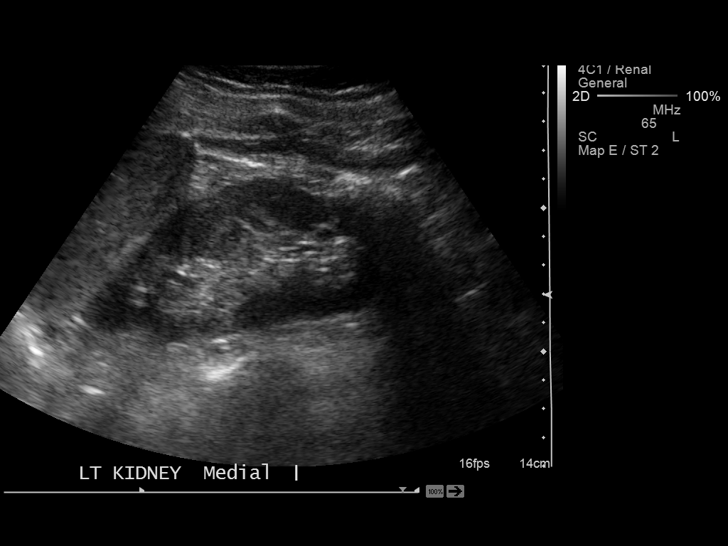
[im 28/34]
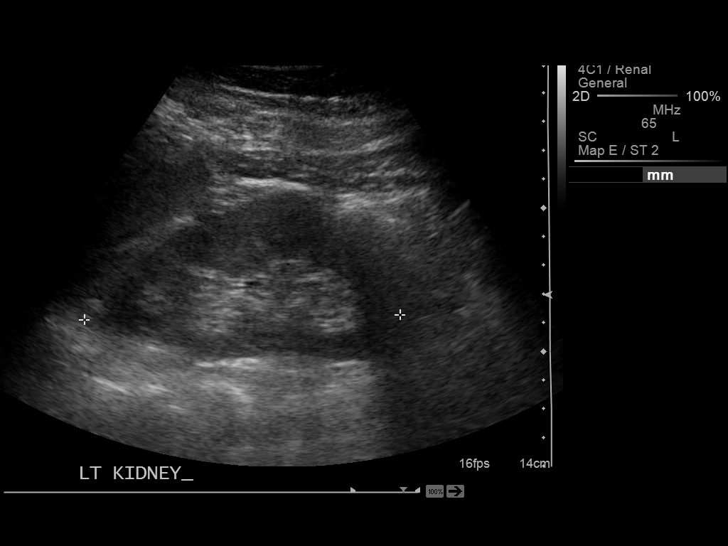
[im 31/34]
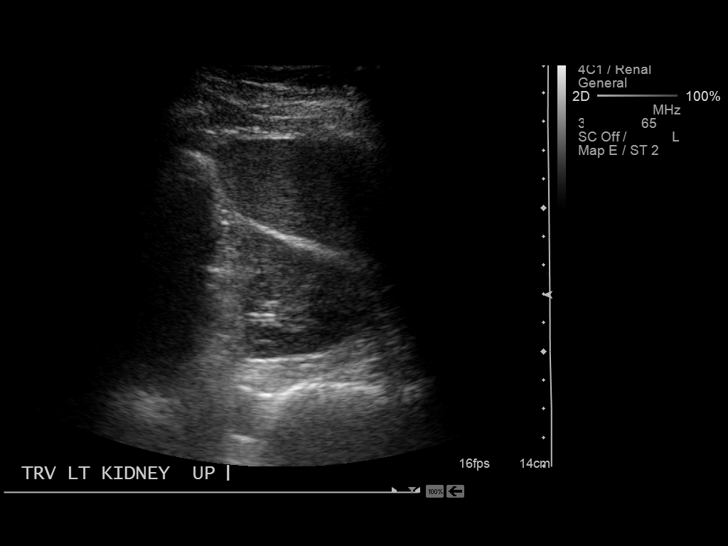
[im 34/34]
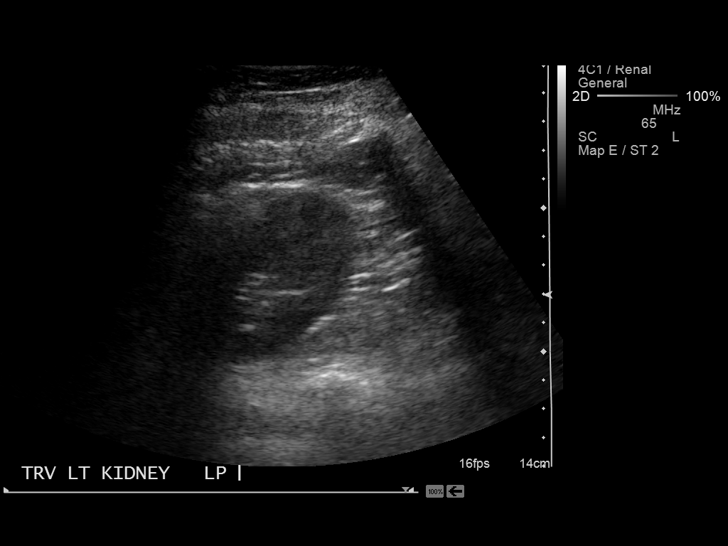

[14 of 25 positions shown; findings below may reference images not displayed]

FINDINGS: Right Kidney:  Measures 11.0 cm.  Parenchymal echogenicity is
normal.  No hydronephrosis.

Left Kidney:  Measures 11.0 cm.  Parenchymal echogenicity is
normal.  No hydronephrosis.

Bladder:  Normal.
IMPRESSION: Normal exam.

## 2013-10-26 DIAGNOSIS — Z6832 Body mass index (BMI) 32.0-32.9, adult: Secondary | ICD-10-CM | POA: Diagnosis not present

## 2013-10-26 DIAGNOSIS — M79609 Pain in unspecified limb: Secondary | ICD-10-CM | POA: Diagnosis not present

## 2013-10-26 DIAGNOSIS — E049 Nontoxic goiter, unspecified: Secondary | ICD-10-CM | POA: Diagnosis not present

## 2013-10-26 DIAGNOSIS — E109 Type 1 diabetes mellitus without complications: Secondary | ICD-10-CM | POA: Diagnosis not present

## 2014-03-15 DIAGNOSIS — Z6833 Body mass index (BMI) 33.0-33.9, adult: Secondary | ICD-10-CM | POA: Diagnosis not present

## 2014-03-15 DIAGNOSIS — E109 Type 1 diabetes mellitus without complications: Secondary | ICD-10-CM | POA: Diagnosis not present

## 2014-03-15 DIAGNOSIS — E049 Nontoxic goiter, unspecified: Secondary | ICD-10-CM | POA: Diagnosis not present

## 2014-03-15 DIAGNOSIS — Z23 Encounter for immunization: Secondary | ICD-10-CM | POA: Diagnosis not present

## 2014-04-03 DIAGNOSIS — F79 Unspecified intellectual disabilities: Secondary | ICD-10-CM | POA: Diagnosis not present

## 2014-04-03 DIAGNOSIS — Z0000 Encounter for general adult medical examination without abnormal findings: Secondary | ICD-10-CM | POA: Diagnosis not present

## 2014-04-03 DIAGNOSIS — Z01419 Encounter for gynecological examination (general) (routine) without abnormal findings: Secondary | ICD-10-CM | POA: Diagnosis not present

## 2014-04-03 DIAGNOSIS — E109 Type 1 diabetes mellitus without complications: Secondary | ICD-10-CM | POA: Diagnosis not present

## 2014-06-05 DIAGNOSIS — E109 Type 1 diabetes mellitus without complications: Secondary | ICD-10-CM | POA: Diagnosis not present

## 2014-06-05 DIAGNOSIS — H5200 Hypermetropia, unspecified eye: Secondary | ICD-10-CM | POA: Diagnosis not present

## 2014-06-05 DIAGNOSIS — H04123 Dry eye syndrome of bilateral lacrimal glands: Secondary | ICD-10-CM | POA: Diagnosis not present

## 2014-09-29 DIAGNOSIS — E049 Nontoxic goiter, unspecified: Secondary | ICD-10-CM | POA: Diagnosis not present

## 2014-09-29 DIAGNOSIS — M79673 Pain in unspecified foot: Secondary | ICD-10-CM | POA: Diagnosis not present

## 2014-09-29 DIAGNOSIS — R209 Unspecified disturbances of skin sensation: Secondary | ICD-10-CM | POA: Diagnosis not present

## 2014-09-29 DIAGNOSIS — Z6833 Body mass index (BMI) 33.0-33.9, adult: Secondary | ICD-10-CM | POA: Diagnosis not present

## 2014-09-29 DIAGNOSIS — E109 Type 1 diabetes mellitus without complications: Secondary | ICD-10-CM | POA: Diagnosis not present

## 2015-02-02 DIAGNOSIS — M79673 Pain in unspecified foot: Secondary | ICD-10-CM | POA: Diagnosis not present

## 2015-02-02 DIAGNOSIS — E049 Nontoxic goiter, unspecified: Secondary | ICD-10-CM | POA: Diagnosis not present

## 2015-02-02 DIAGNOSIS — Z23 Encounter for immunization: Secondary | ICD-10-CM | POA: Diagnosis not present

## 2015-02-02 DIAGNOSIS — E109 Type 1 diabetes mellitus without complications: Secondary | ICD-10-CM | POA: Diagnosis not present

## 2015-02-02 DIAGNOSIS — Z1389 Encounter for screening for other disorder: Secondary | ICD-10-CM | POA: Diagnosis not present

## 2015-04-16 DIAGNOSIS — E109 Type 1 diabetes mellitus without complications: Secondary | ICD-10-CM | POA: Diagnosis not present

## 2015-04-16 DIAGNOSIS — Z124 Encounter for screening for malignant neoplasm of cervix: Secondary | ICD-10-CM | POA: Diagnosis not present

## 2015-04-16 DIAGNOSIS — Z130 Encounter for screening for diseases of the blood and blood-forming organs and certain disorders involving the immune mechanism: Secondary | ICD-10-CM | POA: Diagnosis not present

## 2015-04-16 DIAGNOSIS — F79 Unspecified intellectual disabilities: Secondary | ICD-10-CM | POA: Diagnosis not present

## 2015-04-16 DIAGNOSIS — N361 Urethral diverticulum: Secondary | ICD-10-CM | POA: Diagnosis not present

## 2015-04-16 DIAGNOSIS — Z6832 Body mass index (BMI) 32.0-32.9, adult: Secondary | ICD-10-CM | POA: Diagnosis not present

## 2015-04-16 DIAGNOSIS — Z1151 Encounter for screening for human papillomavirus (HPV): Secondary | ICD-10-CM | POA: Diagnosis not present

## 2015-04-16 DIAGNOSIS — Z01411 Encounter for gynecological examination (general) (routine) with abnormal findings: Secondary | ICD-10-CM | POA: Diagnosis not present

## 2015-04-16 DIAGNOSIS — Z793 Long term (current) use of hormonal contraceptives: Secondary | ICD-10-CM | POA: Diagnosis not present

## 2015-04-16 DIAGNOSIS — Z1389 Encounter for screening for other disorder: Secondary | ICD-10-CM | POA: Diagnosis not present

## 2015-06-06 DIAGNOSIS — M79673 Pain in unspecified foot: Secondary | ICD-10-CM | POA: Diagnosis not present

## 2015-06-06 DIAGNOSIS — E109 Type 1 diabetes mellitus without complications: Secondary | ICD-10-CM | POA: Diagnosis not present

## 2015-06-06 DIAGNOSIS — E048 Other specified nontoxic goiter: Secondary | ICD-10-CM | POA: Diagnosis not present

## 2015-06-06 DIAGNOSIS — Z6833 Body mass index (BMI) 33.0-33.9, adult: Secondary | ICD-10-CM | POA: Diagnosis not present

## 2015-06-07 DIAGNOSIS — H04123 Dry eye syndrome of bilateral lacrimal glands: Secondary | ICD-10-CM | POA: Diagnosis not present

## 2015-06-07 DIAGNOSIS — Z794 Long term (current) use of insulin: Secondary | ICD-10-CM | POA: Diagnosis not present

## 2015-06-07 DIAGNOSIS — Q141 Congenital malformation of retina: Secondary | ICD-10-CM | POA: Diagnosis not present

## 2015-06-07 DIAGNOSIS — E109 Type 1 diabetes mellitus without complications: Secondary | ICD-10-CM | POA: Diagnosis not present

## 2015-10-09 DIAGNOSIS — Z1389 Encounter for screening for other disorder: Secondary | ICD-10-CM | POA: Diagnosis not present

## 2015-10-09 DIAGNOSIS — E109 Type 1 diabetes mellitus without complications: Secondary | ICD-10-CM | POA: Diagnosis not present

## 2015-10-09 DIAGNOSIS — Z23 Encounter for immunization: Secondary | ICD-10-CM | POA: Diagnosis not present

## 2015-10-09 DIAGNOSIS — E048 Other specified nontoxic goiter: Secondary | ICD-10-CM | POA: Diagnosis not present

## 2015-10-09 DIAGNOSIS — M79671 Pain in right foot: Secondary | ICD-10-CM | POA: Diagnosis not present

## 2015-10-09 DIAGNOSIS — M79672 Pain in left foot: Secondary | ICD-10-CM | POA: Diagnosis not present

## 2015-10-09 DIAGNOSIS — Z6833 Body mass index (BMI) 33.0-33.9, adult: Secondary | ICD-10-CM | POA: Diagnosis not present

## 2016-02-13 DIAGNOSIS — Z23 Encounter for immunization: Secondary | ICD-10-CM | POA: Diagnosis not present

## 2016-02-13 DIAGNOSIS — E109 Type 1 diabetes mellitus without complications: Secondary | ICD-10-CM | POA: Diagnosis not present

## 2016-02-13 DIAGNOSIS — E048 Other specified nontoxic goiter: Secondary | ICD-10-CM | POA: Diagnosis not present

## 2016-02-13 DIAGNOSIS — Z6833 Body mass index (BMI) 33.0-33.9, adult: Secondary | ICD-10-CM | POA: Diagnosis not present

## 2016-02-13 DIAGNOSIS — M25571 Pain in right ankle and joints of right foot: Secondary | ICD-10-CM | POA: Diagnosis not present

## 2016-04-23 DIAGNOSIS — R300 Dysuria: Secondary | ICD-10-CM | POA: Diagnosis not present

## 2016-04-23 DIAGNOSIS — Z1151 Encounter for screening for human papillomavirus (HPV): Secondary | ICD-10-CM | POA: Diagnosis not present

## 2016-04-23 DIAGNOSIS — Z1231 Encounter for screening mammogram for malignant neoplasm of breast: Secondary | ICD-10-CM | POA: Diagnosis not present

## 2016-04-23 DIAGNOSIS — Z793 Long term (current) use of hormonal contraceptives: Secondary | ICD-10-CM | POA: Diagnosis not present

## 2016-04-23 DIAGNOSIS — Z01411 Encounter for gynecological examination (general) (routine) with abnormal findings: Secondary | ICD-10-CM | POA: Diagnosis not present

## 2016-04-23 DIAGNOSIS — E109 Type 1 diabetes mellitus without complications: Secondary | ICD-10-CM | POA: Diagnosis not present

## 2016-04-23 DIAGNOSIS — R87619 Unspecified abnormal cytological findings in specimens from cervix uteri: Secondary | ICD-10-CM | POA: Diagnosis not present

## 2016-06-17 DIAGNOSIS — Z1389 Encounter for screening for other disorder: Secondary | ICD-10-CM | POA: Diagnosis not present

## 2016-06-17 DIAGNOSIS — E048 Other specified nontoxic goiter: Secondary | ICD-10-CM | POA: Diagnosis not present

## 2016-06-17 DIAGNOSIS — Z6834 Body mass index (BMI) 34.0-34.9, adult: Secondary | ICD-10-CM | POA: Diagnosis not present

## 2016-06-17 DIAGNOSIS — E109 Type 1 diabetes mellitus without complications: Secondary | ICD-10-CM | POA: Diagnosis not present

## 2016-07-01 DIAGNOSIS — E109 Type 1 diabetes mellitus without complications: Secondary | ICD-10-CM | POA: Diagnosis not present

## 2016-07-01 DIAGNOSIS — Q141 Congenital malformation of retina: Secondary | ICD-10-CM | POA: Diagnosis not present

## 2016-07-01 DIAGNOSIS — H2513 Age-related nuclear cataract, bilateral: Secondary | ICD-10-CM | POA: Diagnosis not present

## 2016-07-01 DIAGNOSIS — H25013 Cortical age-related cataract, bilateral: Secondary | ICD-10-CM | POA: Diagnosis not present

## 2016-09-23 DIAGNOSIS — E048 Other specified nontoxic goiter: Secondary | ICD-10-CM | POA: Diagnosis not present

## 2016-09-23 DIAGNOSIS — M79673 Pain in unspecified foot: Secondary | ICD-10-CM | POA: Diagnosis not present

## 2016-09-23 DIAGNOSIS — Z1389 Encounter for screening for other disorder: Secondary | ICD-10-CM | POA: Diagnosis not present

## 2016-09-23 DIAGNOSIS — E109 Type 1 diabetes mellitus without complications: Secondary | ICD-10-CM | POA: Diagnosis not present

## 2016-09-23 DIAGNOSIS — Z6833 Body mass index (BMI) 33.0-33.9, adult: Secondary | ICD-10-CM | POA: Diagnosis not present

## 2017-01-21 DIAGNOSIS — M25571 Pain in right ankle and joints of right foot: Secondary | ICD-10-CM | POA: Diagnosis not present

## 2017-01-21 DIAGNOSIS — Z23 Encounter for immunization: Secondary | ICD-10-CM | POA: Diagnosis not present

## 2017-01-21 DIAGNOSIS — E119 Type 2 diabetes mellitus without complications: Secondary | ICD-10-CM | POA: Diagnosis not present

## 2017-01-21 DIAGNOSIS — Z6834 Body mass index (BMI) 34.0-34.9, adult: Secondary | ICD-10-CM | POA: Diagnosis not present

## 2017-01-21 DIAGNOSIS — E048 Other specified nontoxic goiter: Secondary | ICD-10-CM | POA: Diagnosis not present

## 2017-04-21 DIAGNOSIS — J189 Pneumonia, unspecified organism: Secondary | ICD-10-CM | POA: Diagnosis not present

## 2017-04-21 DIAGNOSIS — J09X2 Influenza due to identified novel influenza A virus with other respiratory manifestations: Secondary | ICD-10-CM | POA: Diagnosis not present

## 2017-04-21 DIAGNOSIS — R509 Fever, unspecified: Secondary | ICD-10-CM | POA: Diagnosis not present

## 2017-04-21 DIAGNOSIS — R05 Cough: Secondary | ICD-10-CM | POA: Diagnosis not present

## 2017-04-21 DIAGNOSIS — Z6833 Body mass index (BMI) 33.0-33.9, adult: Secondary | ICD-10-CM | POA: Diagnosis not present

## 2017-04-27 DIAGNOSIS — Z1231 Encounter for screening mammogram for malignant neoplasm of breast: Secondary | ICD-10-CM | POA: Diagnosis not present

## 2017-04-27 DIAGNOSIS — Z3041 Encounter for surveillance of contraceptive pills: Secondary | ICD-10-CM | POA: Diagnosis not present

## 2017-04-27 DIAGNOSIS — E109 Type 1 diabetes mellitus without complications: Secondary | ICD-10-CM | POA: Diagnosis not present

## 2017-04-27 DIAGNOSIS — Z01419 Encounter for gynecological examination (general) (routine) without abnormal findings: Secondary | ICD-10-CM | POA: Diagnosis not present

## 2017-05-11 DIAGNOSIS — Z6834 Body mass index (BMI) 34.0-34.9, adult: Secondary | ICD-10-CM | POA: Diagnosis not present

## 2017-05-11 DIAGNOSIS — M722 Plantar fascial fibromatosis: Secondary | ICD-10-CM | POA: Diagnosis not present

## 2017-05-11 DIAGNOSIS — J188 Other pneumonia, unspecified organism: Secondary | ICD-10-CM | POA: Diagnosis not present

## 2017-05-11 DIAGNOSIS — E048 Other specified nontoxic goiter: Secondary | ICD-10-CM | POA: Diagnosis not present

## 2017-05-11 DIAGNOSIS — E109 Type 1 diabetes mellitus without complications: Secondary | ICD-10-CM | POA: Diagnosis not present

## 2017-07-06 DIAGNOSIS — E109 Type 1 diabetes mellitus without complications: Secondary | ICD-10-CM | POA: Diagnosis not present

## 2017-07-06 DIAGNOSIS — H04562 Stenosis of left lacrimal punctum: Secondary | ICD-10-CM | POA: Diagnosis not present

## 2017-07-06 DIAGNOSIS — H04563 Stenosis of bilateral lacrimal punctum: Secondary | ICD-10-CM | POA: Diagnosis not present

## 2017-07-06 DIAGNOSIS — H04123 Dry eye syndrome of bilateral lacrimal glands: Secondary | ICD-10-CM | POA: Diagnosis not present

## 2017-07-06 DIAGNOSIS — H04121 Dry eye syndrome of right lacrimal gland: Secondary | ICD-10-CM | POA: Diagnosis not present

## 2017-07-06 DIAGNOSIS — H04122 Dry eye syndrome of left lacrimal gland: Secondary | ICD-10-CM | POA: Diagnosis not present

## 2017-07-06 DIAGNOSIS — H04561 Stenosis of right lacrimal punctum: Secondary | ICD-10-CM | POA: Diagnosis not present

## 2017-09-10 DIAGNOSIS — M2550 Pain in unspecified joint: Secondary | ICD-10-CM | POA: Diagnosis not present

## 2017-09-10 DIAGNOSIS — E668 Other obesity: Secondary | ICD-10-CM | POA: Diagnosis not present

## 2017-09-10 DIAGNOSIS — E049 Nontoxic goiter, unspecified: Secondary | ICD-10-CM | POA: Diagnosis not present

## 2017-09-10 DIAGNOSIS — Z1389 Encounter for screening for other disorder: Secondary | ICD-10-CM | POA: Diagnosis not present

## 2017-09-10 DIAGNOSIS — E109 Type 1 diabetes mellitus without complications: Secondary | ICD-10-CM | POA: Diagnosis not present

## 2017-09-10 DIAGNOSIS — Z6835 Body mass index (BMI) 35.0-35.9, adult: Secondary | ICD-10-CM | POA: Diagnosis not present

## 2018-01-15 DIAGNOSIS — E668 Other obesity: Secondary | ICD-10-CM | POA: Diagnosis not present

## 2018-01-15 DIAGNOSIS — E048 Other specified nontoxic goiter: Secondary | ICD-10-CM | POA: Diagnosis not present

## 2018-01-15 DIAGNOSIS — E559 Vitamin D deficiency, unspecified: Secondary | ICD-10-CM | POA: Diagnosis not present

## 2018-01-15 DIAGNOSIS — Z6835 Body mass index (BMI) 35.0-35.9, adult: Secondary | ICD-10-CM | POA: Diagnosis not present

## 2018-01-15 DIAGNOSIS — Z23 Encounter for immunization: Secondary | ICD-10-CM | POA: Diagnosis not present

## 2018-01-15 DIAGNOSIS — E7849 Other hyperlipidemia: Secondary | ICD-10-CM | POA: Diagnosis not present

## 2018-01-15 DIAGNOSIS — E109 Type 1 diabetes mellitus without complications: Secondary | ICD-10-CM | POA: Diagnosis not present

## 2018-05-20 DIAGNOSIS — J069 Acute upper respiratory infection, unspecified: Secondary | ICD-10-CM | POA: Diagnosis not present

## 2018-05-20 DIAGNOSIS — Z6835 Body mass index (BMI) 35.0-35.9, adult: Secondary | ICD-10-CM | POA: Diagnosis not present

## 2018-05-20 DIAGNOSIS — R05 Cough: Secondary | ICD-10-CM | POA: Diagnosis not present

## 2018-06-15 DIAGNOSIS — Z6835 Body mass index (BMI) 35.0-35.9, adult: Secondary | ICD-10-CM | POA: Diagnosis not present

## 2018-06-15 DIAGNOSIS — E7849 Other hyperlipidemia: Secondary | ICD-10-CM | POA: Diagnosis not present

## 2018-06-15 DIAGNOSIS — E559 Vitamin D deficiency, unspecified: Secondary | ICD-10-CM | POA: Diagnosis not present

## 2018-06-15 DIAGNOSIS — E049 Nontoxic goiter, unspecified: Secondary | ICD-10-CM | POA: Diagnosis not present

## 2018-06-15 DIAGNOSIS — R233 Spontaneous ecchymoses: Secondary | ICD-10-CM | POA: Diagnosis not present

## 2018-06-15 DIAGNOSIS — F79 Unspecified intellectual disabilities: Secondary | ICD-10-CM | POA: Diagnosis not present

## 2018-06-15 DIAGNOSIS — E669 Obesity, unspecified: Secondary | ICD-10-CM | POA: Diagnosis not present

## 2018-06-15 DIAGNOSIS — E109 Type 1 diabetes mellitus without complications: Secondary | ICD-10-CM | POA: Diagnosis not present

## 2018-08-03 DIAGNOSIS — E109 Type 1 diabetes mellitus without complications: Secondary | ICD-10-CM | POA: Diagnosis not present

## 2018-08-03 DIAGNOSIS — H25013 Cortical age-related cataract, bilateral: Secondary | ICD-10-CM | POA: Diagnosis not present

## 2018-08-03 DIAGNOSIS — H53023 Refractive amblyopia, bilateral: Secondary | ICD-10-CM | POA: Diagnosis not present

## 2018-08-03 DIAGNOSIS — H2513 Age-related nuclear cataract, bilateral: Secondary | ICD-10-CM | POA: Diagnosis not present

## 2018-09-09 DIAGNOSIS — E109 Type 1 diabetes mellitus without complications: Secondary | ICD-10-CM | POA: Diagnosis not present

## 2018-09-09 DIAGNOSIS — R05 Cough: Secondary | ICD-10-CM | POA: Diagnosis not present

## 2018-09-09 DIAGNOSIS — J209 Acute bronchitis, unspecified: Secondary | ICD-10-CM | POA: Diagnosis not present

## 2018-09-27 DIAGNOSIS — J209 Acute bronchitis, unspecified: Secondary | ICD-10-CM | POA: Diagnosis not present

## 2018-09-27 DIAGNOSIS — R05 Cough: Secondary | ICD-10-CM | POA: Diagnosis not present

## 2018-09-27 DIAGNOSIS — K219 Gastro-esophageal reflux disease without esophagitis: Secondary | ICD-10-CM | POA: Diagnosis not present

## 2018-10-12 DIAGNOSIS — Z1331 Encounter for screening for depression: Secondary | ICD-10-CM | POA: Diagnosis not present

## 2018-10-12 DIAGNOSIS — E049 Nontoxic goiter, unspecified: Secondary | ICD-10-CM | POA: Diagnosis not present

## 2018-10-12 DIAGNOSIS — Z794 Long term (current) use of insulin: Secondary | ICD-10-CM | POA: Diagnosis not present

## 2018-10-12 DIAGNOSIS — K219 Gastro-esophageal reflux disease without esophagitis: Secondary | ICD-10-CM | POA: Diagnosis not present

## 2018-10-12 DIAGNOSIS — E109 Type 1 diabetes mellitus without complications: Secondary | ICD-10-CM | POA: Diagnosis not present

## 2018-10-12 DIAGNOSIS — E559 Vitamin D deficiency, unspecified: Secondary | ICD-10-CM | POA: Diagnosis not present

## 2018-10-12 DIAGNOSIS — E669 Obesity, unspecified: Secondary | ICD-10-CM | POA: Diagnosis not present

## 2018-10-12 DIAGNOSIS — E785 Hyperlipidemia, unspecified: Secondary | ICD-10-CM | POA: Diagnosis not present

## 2019-02-11 DIAGNOSIS — Z23 Encounter for immunization: Secondary | ICD-10-CM | POA: Diagnosis not present

## 2019-02-11 DIAGNOSIS — E559 Vitamin D deficiency, unspecified: Secondary | ICD-10-CM | POA: Diagnosis not present

## 2019-02-11 DIAGNOSIS — J45909 Unspecified asthma, uncomplicated: Secondary | ICD-10-CM | POA: Diagnosis not present

## 2019-02-11 DIAGNOSIS — E109 Type 1 diabetes mellitus without complications: Secondary | ICD-10-CM | POA: Diagnosis not present

## 2019-02-11 DIAGNOSIS — E669 Obesity, unspecified: Secondary | ICD-10-CM | POA: Diagnosis not present

## 2019-02-11 DIAGNOSIS — E785 Hyperlipidemia, unspecified: Secondary | ICD-10-CM | POA: Diagnosis not present

## 2019-02-11 DIAGNOSIS — E049 Nontoxic goiter, unspecified: Secondary | ICD-10-CM | POA: Diagnosis not present

## 2019-06-15 DIAGNOSIS — Z01419 Encounter for gynecological examination (general) (routine) without abnormal findings: Secondary | ICD-10-CM | POA: Diagnosis not present

## 2019-06-15 DIAGNOSIS — Z3041 Encounter for surveillance of contraceptive pills: Secondary | ICD-10-CM | POA: Diagnosis not present

## 2019-06-15 DIAGNOSIS — E109 Type 1 diabetes mellitus without complications: Secondary | ICD-10-CM | POA: Diagnosis not present

## 2019-06-15 DIAGNOSIS — Z1231 Encounter for screening mammogram for malignant neoplasm of breast: Secondary | ICD-10-CM | POA: Diagnosis not present

## 2019-06-15 DIAGNOSIS — R300 Dysuria: Secondary | ICD-10-CM | POA: Diagnosis not present

## 2019-06-28 DIAGNOSIS — E669 Obesity, unspecified: Secondary | ICD-10-CM | POA: Diagnosis not present

## 2019-06-28 DIAGNOSIS — Z794 Long term (current) use of insulin: Secondary | ICD-10-CM | POA: Diagnosis not present

## 2019-06-28 DIAGNOSIS — F79 Unspecified intellectual disabilities: Secondary | ICD-10-CM | POA: Diagnosis not present

## 2019-06-28 DIAGNOSIS — E109 Type 1 diabetes mellitus without complications: Secondary | ICD-10-CM | POA: Diagnosis not present

## 2019-06-28 DIAGNOSIS — K219 Gastro-esophageal reflux disease without esophagitis: Secondary | ICD-10-CM | POA: Diagnosis not present

## 2019-06-28 DIAGNOSIS — E049 Nontoxic goiter, unspecified: Secondary | ICD-10-CM | POA: Diagnosis not present

## 2019-06-28 DIAGNOSIS — E785 Hyperlipidemia, unspecified: Secondary | ICD-10-CM | POA: Diagnosis not present

## 2019-07-28 DIAGNOSIS — M545 Low back pain: Secondary | ICD-10-CM | POA: Diagnosis not present

## 2019-08-05 DIAGNOSIS — E109 Type 1 diabetes mellitus without complications: Secondary | ICD-10-CM | POA: Diagnosis not present

## 2019-08-05 DIAGNOSIS — Q141 Congenital malformation of retina: Secondary | ICD-10-CM | POA: Diagnosis not present

## 2019-08-05 DIAGNOSIS — H25013 Cortical age-related cataract, bilateral: Secondary | ICD-10-CM | POA: Diagnosis not present

## 2019-08-05 DIAGNOSIS — H40033 Anatomical narrow angle, bilateral: Secondary | ICD-10-CM | POA: Diagnosis not present

## 2019-10-25 DIAGNOSIS — F79 Unspecified intellectual disabilities: Secondary | ICD-10-CM | POA: Diagnosis not present

## 2019-10-25 DIAGNOSIS — E559 Vitamin D deficiency, unspecified: Secondary | ICD-10-CM | POA: Diagnosis not present

## 2019-10-25 DIAGNOSIS — E109 Type 1 diabetes mellitus without complications: Secondary | ICD-10-CM | POA: Diagnosis not present

## 2019-10-25 DIAGNOSIS — E049 Nontoxic goiter, unspecified: Secondary | ICD-10-CM | POA: Diagnosis not present

## 2019-10-25 DIAGNOSIS — E669 Obesity, unspecified: Secondary | ICD-10-CM | POA: Diagnosis not present

## 2019-10-25 DIAGNOSIS — J45909 Unspecified asthma, uncomplicated: Secondary | ICD-10-CM | POA: Diagnosis not present

## 2019-10-25 DIAGNOSIS — Z794 Long term (current) use of insulin: Secondary | ICD-10-CM | POA: Diagnosis not present

## 2019-10-25 DIAGNOSIS — E7849 Other hyperlipidemia: Secondary | ICD-10-CM | POA: Diagnosis not present

## 2019-10-25 DIAGNOSIS — M545 Low back pain: Secondary | ICD-10-CM | POA: Diagnosis not present

## 2019-11-14 DIAGNOSIS — M545 Low back pain: Secondary | ICD-10-CM | POA: Diagnosis not present

## 2019-11-14 DIAGNOSIS — M5417 Radiculopathy, lumbosacral region: Secondary | ICD-10-CM | POA: Diagnosis not present

## 2019-11-24 DIAGNOSIS — M545 Low back pain: Secondary | ICD-10-CM | POA: Diagnosis not present

## 2019-12-02 DIAGNOSIS — M5416 Radiculopathy, lumbar region: Secondary | ICD-10-CM | POA: Diagnosis not present

## 2019-12-02 DIAGNOSIS — M545 Low back pain: Secondary | ICD-10-CM | POA: Diagnosis not present

## 2019-12-02 DIAGNOSIS — E109 Type 1 diabetes mellitus without complications: Secondary | ICD-10-CM | POA: Diagnosis not present

## 2019-12-02 DIAGNOSIS — M5136 Other intervertebral disc degeneration, lumbar region: Secondary | ICD-10-CM | POA: Diagnosis not present

## 2019-12-19 DIAGNOSIS — M5416 Radiculopathy, lumbar region: Secondary | ICD-10-CM | POA: Diagnosis not present

## 2019-12-23 DIAGNOSIS — M5416 Radiculopathy, lumbar region: Secondary | ICD-10-CM | POA: Diagnosis not present

## 2019-12-29 DIAGNOSIS — M5416 Radiculopathy, lumbar region: Secondary | ICD-10-CM | POA: Diagnosis not present

## 2020-01-03 DIAGNOSIS — N83201 Unspecified ovarian cyst, right side: Secondary | ICD-10-CM | POA: Diagnosis not present

## 2020-01-03 DIAGNOSIS — N8301 Follicular cyst of right ovary: Secondary | ICD-10-CM | POA: Diagnosis not present

## 2020-01-03 DIAGNOSIS — F79 Unspecified intellectual disabilities: Secondary | ICD-10-CM | POA: Diagnosis not present

## 2020-01-05 DIAGNOSIS — M5416 Radiculopathy, lumbar region: Secondary | ICD-10-CM | POA: Diagnosis not present

## 2020-01-10 DIAGNOSIS — M5416 Radiculopathy, lumbar region: Secondary | ICD-10-CM | POA: Diagnosis not present

## 2020-01-12 DIAGNOSIS — M5416 Radiculopathy, lumbar region: Secondary | ICD-10-CM | POA: Diagnosis not present

## 2020-01-17 DIAGNOSIS — M5416 Radiculopathy, lumbar region: Secondary | ICD-10-CM | POA: Diagnosis not present

## 2020-01-19 DIAGNOSIS — M5416 Radiculopathy, lumbar region: Secondary | ICD-10-CM | POA: Diagnosis not present

## 2020-01-24 DIAGNOSIS — M5416 Radiculopathy, lumbar region: Secondary | ICD-10-CM | POA: Diagnosis not present

## 2020-01-26 DIAGNOSIS — M5416 Radiculopathy, lumbar region: Secondary | ICD-10-CM | POA: Diagnosis not present

## 2020-01-31 DIAGNOSIS — M5416 Radiculopathy, lumbar region: Secondary | ICD-10-CM | POA: Diagnosis not present

## 2020-02-02 DIAGNOSIS — M5416 Radiculopathy, lumbar region: Secondary | ICD-10-CM | POA: Diagnosis not present

## 2020-02-06 DIAGNOSIS — M5416 Radiculopathy, lumbar region: Secondary | ICD-10-CM | POA: Diagnosis not present

## 2020-02-15 DIAGNOSIS — M5416 Radiculopathy, lumbar region: Secondary | ICD-10-CM | POA: Diagnosis not present

## 2020-02-22 DIAGNOSIS — M5416 Radiculopathy, lumbar region: Secondary | ICD-10-CM | POA: Diagnosis not present

## 2020-02-28 DIAGNOSIS — M5416 Radiculopathy, lumbar region: Secondary | ICD-10-CM | POA: Diagnosis not present

## 2020-02-29 DIAGNOSIS — E559 Vitamin D deficiency, unspecified: Secondary | ICD-10-CM | POA: Diagnosis not present

## 2020-02-29 DIAGNOSIS — E785 Hyperlipidemia, unspecified: Secondary | ICD-10-CM | POA: Diagnosis not present

## 2020-02-29 DIAGNOSIS — K219 Gastro-esophageal reflux disease without esophagitis: Secondary | ICD-10-CM | POA: Diagnosis not present

## 2020-02-29 DIAGNOSIS — E669 Obesity, unspecified: Secondary | ICD-10-CM | POA: Diagnosis not present

## 2020-02-29 DIAGNOSIS — J45909 Unspecified asthma, uncomplicated: Secondary | ICD-10-CM | POA: Diagnosis not present

## 2020-02-29 DIAGNOSIS — E049 Nontoxic goiter, unspecified: Secondary | ICD-10-CM | POA: Diagnosis not present

## 2020-02-29 DIAGNOSIS — L509 Urticaria, unspecified: Secondary | ICD-10-CM | POA: Diagnosis not present

## 2020-02-29 DIAGNOSIS — E109 Type 1 diabetes mellitus without complications: Secondary | ICD-10-CM | POA: Diagnosis not present

## 2020-02-29 DIAGNOSIS — M722 Plantar fascial fibromatosis: Secondary | ICD-10-CM | POA: Diagnosis not present

## 2020-02-29 DIAGNOSIS — Z794 Long term (current) use of insulin: Secondary | ICD-10-CM | POA: Diagnosis not present

## 2020-02-29 DIAGNOSIS — Z23 Encounter for immunization: Secondary | ICD-10-CM | POA: Diagnosis not present

## 2020-03-06 ENCOUNTER — Ambulatory Visit: Payer: Medicare Other | Attending: Internal Medicine

## 2020-03-06 ENCOUNTER — Ambulatory Visit: Payer: Self-pay

## 2020-03-06 DIAGNOSIS — Z23 Encounter for immunization: Secondary | ICD-10-CM

## 2020-03-06 NOTE — Progress Notes (Signed)
   Covid-19 Vaccination Clinic  Name:  Ashley Valdez    MRN: [id number] DOB: [birth year]  03/06/2020  [name] was observed post Covid-19 immunization for 15 minutes without incident. She was provided with Vaccine Information Sheet and instruction to access the V-Safe system.   [name] was instructed to call 911 with any severe reactions post vaccine: Marland Kitchen Difficulty breathing  . Swelling of face and throat  . A fast heartbeat  . A bad rash all over body  . Dizziness and weakness

## 2020-03-07 DIAGNOSIS — M5416 Radiculopathy, lumbar region: Secondary | ICD-10-CM | POA: Diagnosis not present

## 2020-03-14 DIAGNOSIS — M5416 Radiculopathy, lumbar region: Secondary | ICD-10-CM | POA: Diagnosis not present

## 2020-03-21 DIAGNOSIS — M5416 Radiculopathy, lumbar region: Secondary | ICD-10-CM | POA: Diagnosis not present

## 2020-04-30 DIAGNOSIS — R059 Cough, unspecified: Secondary | ICD-10-CM | POA: Diagnosis not present

## 2020-04-30 DIAGNOSIS — J45909 Unspecified asthma, uncomplicated: Secondary | ICD-10-CM | POA: Diagnosis not present

## 2020-04-30 DIAGNOSIS — Z1152 Encounter for screening for COVID-19: Secondary | ICD-10-CM | POA: Diagnosis not present

## 2020-04-30 DIAGNOSIS — E109 Type 1 diabetes mellitus without complications: Secondary | ICD-10-CM | POA: Diagnosis not present

## 2020-04-30 DIAGNOSIS — J209 Acute bronchitis, unspecified: Secondary | ICD-10-CM | POA: Diagnosis not present

## 2020-05-28 DIAGNOSIS — R059 Cough, unspecified: Secondary | ICD-10-CM | POA: Diagnosis not present

## 2020-05-28 DIAGNOSIS — J189 Pneumonia, unspecified organism: Secondary | ICD-10-CM | POA: Diagnosis not present

## 2020-05-28 DIAGNOSIS — J45909 Unspecified asthma, uncomplicated: Secondary | ICD-10-CM | POA: Diagnosis not present

## 2020-05-28 DIAGNOSIS — E109 Type 1 diabetes mellitus without complications: Secondary | ICD-10-CM | POA: Diagnosis not present

## 2020-05-28 DIAGNOSIS — Z1152 Encounter for screening for COVID-19: Secondary | ICD-10-CM | POA: Diagnosis not present

## 2020-07-05 DIAGNOSIS — E785 Hyperlipidemia, unspecified: Secondary | ICD-10-CM | POA: Diagnosis not present

## 2020-07-05 DIAGNOSIS — R059 Cough, unspecified: Secondary | ICD-10-CM | POA: Diagnosis not present

## 2020-07-05 DIAGNOSIS — E669 Obesity, unspecified: Secondary | ICD-10-CM | POA: Diagnosis not present

## 2020-07-05 DIAGNOSIS — M722 Plantar fascial fibromatosis: Secondary | ICD-10-CM | POA: Diagnosis not present

## 2020-07-05 DIAGNOSIS — E049 Nontoxic goiter, unspecified: Secondary | ICD-10-CM | POA: Diagnosis not present

## 2020-07-05 DIAGNOSIS — E109 Type 1 diabetes mellitus without complications: Secondary | ICD-10-CM | POA: Diagnosis not present

## 2020-07-05 DIAGNOSIS — F79 Unspecified intellectual disabilities: Secondary | ICD-10-CM | POA: Diagnosis not present

## 2020-07-05 DIAGNOSIS — Z794 Long term (current) use of insulin: Secondary | ICD-10-CM | POA: Diagnosis not present

## 2020-07-05 DIAGNOSIS — E559 Vitamin D deficiency, unspecified: Secondary | ICD-10-CM | POA: Diagnosis not present

## 2020-07-05 DIAGNOSIS — M79673 Pain in unspecified foot: Secondary | ICD-10-CM | POA: Diagnosis not present

## 2020-08-09 DIAGNOSIS — H40033 Anatomical narrow angle, bilateral: Secondary | ICD-10-CM | POA: Diagnosis not present

## 2020-08-09 DIAGNOSIS — H532 Diplopia: Secondary | ICD-10-CM | POA: Diagnosis not present

## 2020-08-28 DIAGNOSIS — H40031 Anatomical narrow angle, right eye: Secondary | ICD-10-CM | POA: Diagnosis not present

## 2020-09-11 DIAGNOSIS — H40032 Anatomical narrow angle, left eye: Secondary | ICD-10-CM | POA: Diagnosis not present

## 2020-11-01 DIAGNOSIS — H04123 Dry eye syndrome of bilateral lacrimal glands: Secondary | ICD-10-CM | POA: Diagnosis not present

## 2020-11-01 DIAGNOSIS — H532 Diplopia: Secondary | ICD-10-CM | POA: Diagnosis not present

## 2020-11-01 DIAGNOSIS — H40033 Anatomical narrow angle, bilateral: Secondary | ICD-10-CM | POA: Diagnosis not present

## 2020-11-07 DIAGNOSIS — L509 Urticaria, unspecified: Secondary | ICD-10-CM | POA: Diagnosis not present

## 2020-11-07 DIAGNOSIS — E669 Obesity, unspecified: Secondary | ICD-10-CM | POA: Diagnosis not present

## 2020-11-07 DIAGNOSIS — G5601 Carpal tunnel syndrome, right upper limb: Secondary | ICD-10-CM | POA: Diagnosis not present

## 2020-11-07 DIAGNOSIS — E785 Hyperlipidemia, unspecified: Secondary | ICD-10-CM | POA: Diagnosis not present

## 2020-11-07 DIAGNOSIS — E109 Type 1 diabetes mellitus without complications: Secondary | ICD-10-CM | POA: Diagnosis not present

## 2020-11-07 DIAGNOSIS — M722 Plantar fascial fibromatosis: Secondary | ICD-10-CM | POA: Diagnosis not present

## 2020-11-07 DIAGNOSIS — K219 Gastro-esophageal reflux disease without esophagitis: Secondary | ICD-10-CM | POA: Diagnosis not present

## 2020-11-07 DIAGNOSIS — J45909 Unspecified asthma, uncomplicated: Secondary | ICD-10-CM | POA: Diagnosis not present

## 2020-11-07 DIAGNOSIS — F79 Unspecified intellectual disabilities: Secondary | ICD-10-CM | POA: Diagnosis not present

## 2020-11-07 DIAGNOSIS — Z794 Long term (current) use of insulin: Secondary | ICD-10-CM | POA: Diagnosis not present

## 2020-11-07 DIAGNOSIS — E049 Nontoxic goiter, unspecified: Secondary | ICD-10-CM | POA: Diagnosis not present

## 2020-11-07 DIAGNOSIS — E559 Vitamin D deficiency, unspecified: Secondary | ICD-10-CM | POA: Diagnosis not present

## 2020-12-11 DIAGNOSIS — R620 Delayed milestone in childhood: Secondary | ICD-10-CM | POA: Diagnosis not present

## 2020-12-11 DIAGNOSIS — H5034 Intermittent alternating exotropia: Secondary | ICD-10-CM | POA: Diagnosis not present

## 2021-03-19 DIAGNOSIS — E669 Obesity, unspecified: Secondary | ICD-10-CM | POA: Diagnosis not present

## 2021-03-19 DIAGNOSIS — E559 Vitamin D deficiency, unspecified: Secondary | ICD-10-CM | POA: Diagnosis not present

## 2021-03-19 DIAGNOSIS — H409 Unspecified glaucoma: Secondary | ICD-10-CM | POA: Diagnosis not present

## 2021-03-19 DIAGNOSIS — M722 Plantar fascial fibromatosis: Secondary | ICD-10-CM | POA: Diagnosis not present

## 2021-03-19 DIAGNOSIS — E785 Hyperlipidemia, unspecified: Secondary | ICD-10-CM | POA: Diagnosis not present

## 2021-03-19 DIAGNOSIS — Z794 Long term (current) use of insulin: Secondary | ICD-10-CM | POA: Diagnosis not present

## 2021-03-19 DIAGNOSIS — E109 Type 1 diabetes mellitus without complications: Secondary | ICD-10-CM | POA: Diagnosis not present

## 2021-03-19 DIAGNOSIS — F79 Unspecified intellectual disabilities: Secondary | ICD-10-CM | POA: Diagnosis not present

## 2021-03-19 DIAGNOSIS — E049 Nontoxic goiter, unspecified: Secondary | ICD-10-CM | POA: Diagnosis not present

## 2021-03-19 DIAGNOSIS — K219 Gastro-esophageal reflux disease without esophagitis: Secondary | ICD-10-CM | POA: Diagnosis not present

## 2021-03-26 DIAGNOSIS — H35362 Drusen (degenerative) of macula, left eye: Secondary | ICD-10-CM | POA: Diagnosis not present

## 2021-03-26 DIAGNOSIS — E109 Type 1 diabetes mellitus without complications: Secondary | ICD-10-CM | POA: Diagnosis not present

## 2021-03-26 DIAGNOSIS — H532 Diplopia: Secondary | ICD-10-CM | POA: Diagnosis not present

## 2021-03-26 DIAGNOSIS — H40033 Anatomical narrow angle, bilateral: Secondary | ICD-10-CM | POA: Diagnosis not present

## 2021-07-22 DIAGNOSIS — L509 Urticaria, unspecified: Secondary | ICD-10-CM | POA: Diagnosis not present

## 2021-07-22 DIAGNOSIS — E669 Obesity, unspecified: Secondary | ICD-10-CM | POA: Diagnosis not present

## 2021-07-22 DIAGNOSIS — Z794 Long term (current) use of insulin: Secondary | ICD-10-CM | POA: Diagnosis not present

## 2021-07-22 DIAGNOSIS — E109 Type 1 diabetes mellitus without complications: Secondary | ICD-10-CM | POA: Diagnosis not present

## 2021-07-22 DIAGNOSIS — K219 Gastro-esophageal reflux disease without esophagitis: Secondary | ICD-10-CM | POA: Diagnosis not present

## 2021-07-22 DIAGNOSIS — H409 Unspecified glaucoma: Secondary | ICD-10-CM | POA: Diagnosis not present

## 2021-07-22 DIAGNOSIS — F79 Unspecified intellectual disabilities: Secondary | ICD-10-CM | POA: Diagnosis not present

## 2021-07-22 DIAGNOSIS — E785 Hyperlipidemia, unspecified: Secondary | ICD-10-CM | POA: Diagnosis not present

## 2021-07-22 DIAGNOSIS — E049 Nontoxic goiter, unspecified: Secondary | ICD-10-CM | POA: Diagnosis not present

## 2021-11-04 DIAGNOSIS — E109 Type 1 diabetes mellitus without complications: Secondary | ICD-10-CM | POA: Diagnosis not present

## 2021-11-04 DIAGNOSIS — J45909 Unspecified asthma, uncomplicated: Secondary | ICD-10-CM | POA: Diagnosis not present

## 2021-11-04 DIAGNOSIS — Z1152 Encounter for screening for COVID-19: Secondary | ICD-10-CM | POA: Diagnosis not present

## 2021-11-04 DIAGNOSIS — J209 Acute bronchitis, unspecified: Secondary | ICD-10-CM | POA: Diagnosis not present

## 2021-11-06 DIAGNOSIS — E785 Hyperlipidemia, unspecified: Secondary | ICD-10-CM | POA: Diagnosis not present

## 2021-11-06 DIAGNOSIS — E559 Vitamin D deficiency, unspecified: Secondary | ICD-10-CM | POA: Diagnosis not present

## 2021-11-06 DIAGNOSIS — E109 Type 1 diabetes mellitus without complications: Secondary | ICD-10-CM | POA: Diagnosis not present

## 2021-11-06 DIAGNOSIS — Z0000 Encounter for general adult medical examination without abnormal findings: Secondary | ICD-10-CM | POA: Diagnosis not present

## 2021-11-06 DIAGNOSIS — E049 Nontoxic goiter, unspecified: Secondary | ICD-10-CM | POA: Diagnosis not present

## 2021-11-06 DIAGNOSIS — R7989 Other specified abnormal findings of blood chemistry: Secondary | ICD-10-CM | POA: Diagnosis not present

## 2021-11-13 DIAGNOSIS — F79 Unspecified intellectual disabilities: Secondary | ICD-10-CM | POA: Diagnosis not present

## 2021-11-13 DIAGNOSIS — Z0000 Encounter for general adult medical examination without abnormal findings: Secondary | ICD-10-CM | POA: Diagnosis not present

## 2021-11-13 DIAGNOSIS — E785 Hyperlipidemia, unspecified: Secondary | ICD-10-CM | POA: Diagnosis not present

## 2021-11-13 DIAGNOSIS — E109 Type 1 diabetes mellitus without complications: Secondary | ICD-10-CM | POA: Diagnosis not present

## 2021-11-13 DIAGNOSIS — E049 Nontoxic goiter, unspecified: Secondary | ICD-10-CM | POA: Diagnosis not present

## 2021-11-13 DIAGNOSIS — H409 Unspecified glaucoma: Secondary | ICD-10-CM | POA: Diagnosis not present

## 2021-11-13 DIAGNOSIS — G5601 Carpal tunnel syndrome, right upper limb: Secondary | ICD-10-CM | POA: Diagnosis not present

## 2021-11-13 DIAGNOSIS — Z1339 Encounter for screening examination for other mental health and behavioral disorders: Secondary | ICD-10-CM | POA: Diagnosis not present

## 2021-11-13 DIAGNOSIS — E669 Obesity, unspecified: Secondary | ICD-10-CM | POA: Diagnosis not present

## 2021-11-13 DIAGNOSIS — J45909 Unspecified asthma, uncomplicated: Secondary | ICD-10-CM | POA: Diagnosis not present

## 2021-11-13 DIAGNOSIS — Z794 Long term (current) use of insulin: Secondary | ICD-10-CM | POA: Diagnosis not present

## 2021-11-13 DIAGNOSIS — Z1331 Encounter for screening for depression: Secondary | ICD-10-CM | POA: Diagnosis not present

## 2021-11-13 DIAGNOSIS — R82998 Other abnormal findings in urine: Secondary | ICD-10-CM | POA: Diagnosis not present

## 2022-04-01 DIAGNOSIS — Q141 Congenital malformation of retina: Secondary | ICD-10-CM | POA: Diagnosis not present

## 2022-04-01 DIAGNOSIS — H04123 Dry eye syndrome of bilateral lacrimal glands: Secondary | ICD-10-CM | POA: Diagnosis not present

## 2022-04-01 DIAGNOSIS — E109 Type 1 diabetes mellitus without complications: Secondary | ICD-10-CM | POA: Diagnosis not present

## 2022-04-01 DIAGNOSIS — H532 Diplopia: Secondary | ICD-10-CM | POA: Diagnosis not present

## 2022-04-02 DIAGNOSIS — E785 Hyperlipidemia, unspecified: Secondary | ICD-10-CM | POA: Diagnosis not present

## 2022-04-02 DIAGNOSIS — E669 Obesity, unspecified: Secondary | ICD-10-CM | POA: Diagnosis not present

## 2022-04-02 DIAGNOSIS — E559 Vitamin D deficiency, unspecified: Secondary | ICD-10-CM | POA: Diagnosis not present

## 2022-04-02 DIAGNOSIS — E049 Nontoxic goiter, unspecified: Secondary | ICD-10-CM | POA: Diagnosis not present

## 2022-04-02 DIAGNOSIS — E109 Type 1 diabetes mellitus without complications: Secondary | ICD-10-CM | POA: Diagnosis not present

## 2022-04-02 DIAGNOSIS — J45909 Unspecified asthma, uncomplicated: Secondary | ICD-10-CM | POA: Diagnosis not present

## 2022-04-02 DIAGNOSIS — K219 Gastro-esophageal reflux disease without esophagitis: Secondary | ICD-10-CM | POA: Diagnosis not present

## 2022-04-02 DIAGNOSIS — Z794 Long term (current) use of insulin: Secondary | ICD-10-CM | POA: Diagnosis not present

## 2022-04-02 DIAGNOSIS — Z23 Encounter for immunization: Secondary | ICD-10-CM | POA: Diagnosis not present

## 2022-04-02 DIAGNOSIS — F79 Unspecified intellectual disabilities: Secondary | ICD-10-CM | POA: Diagnosis not present

## 2022-07-21 DIAGNOSIS — J208 Acute bronchitis due to other specified organisms: Secondary | ICD-10-CM | POA: Diagnosis not present

## 2022-07-21 DIAGNOSIS — Z1152 Encounter for screening for COVID-19: Secondary | ICD-10-CM | POA: Diagnosis not present

## 2022-07-21 DIAGNOSIS — R0981 Nasal congestion: Secondary | ICD-10-CM | POA: Diagnosis not present

## 2022-07-21 DIAGNOSIS — E109 Type 1 diabetes mellitus without complications: Secondary | ICD-10-CM | POA: Diagnosis not present

## 2022-07-21 DIAGNOSIS — R058 Other specified cough: Secondary | ICD-10-CM | POA: Diagnosis not present

## 2022-07-21 DIAGNOSIS — R5383 Other fatigue: Secondary | ICD-10-CM | POA: Diagnosis not present

## 2022-07-21 DIAGNOSIS — J45909 Unspecified asthma, uncomplicated: Secondary | ICD-10-CM | POA: Diagnosis not present

## 2022-08-04 DIAGNOSIS — E109 Type 1 diabetes mellitus without complications: Secondary | ICD-10-CM | POA: Diagnosis not present

## 2022-08-04 DIAGNOSIS — E669 Obesity, unspecified: Secondary | ICD-10-CM | POA: Diagnosis not present

## 2022-08-04 DIAGNOSIS — K219 Gastro-esophageal reflux disease without esophagitis: Secondary | ICD-10-CM | POA: Diagnosis not present

## 2022-08-04 DIAGNOSIS — J45909 Unspecified asthma, uncomplicated: Secondary | ICD-10-CM | POA: Diagnosis not present

## 2022-08-04 DIAGNOSIS — G5601 Carpal tunnel syndrome, right upper limb: Secondary | ICD-10-CM | POA: Diagnosis not present

## 2022-08-04 DIAGNOSIS — F79 Unspecified intellectual disabilities: Secondary | ICD-10-CM | POA: Diagnosis not present

## 2022-08-04 DIAGNOSIS — E785 Hyperlipidemia, unspecified: Secondary | ICD-10-CM | POA: Diagnosis not present

## 2022-08-04 DIAGNOSIS — E049 Nontoxic goiter, unspecified: Secondary | ICD-10-CM | POA: Diagnosis not present

## 2022-08-04 DIAGNOSIS — Z794 Long term (current) use of insulin: Secondary | ICD-10-CM | POA: Diagnosis not present

## 2022-12-02 DIAGNOSIS — E109 Type 1 diabetes mellitus without complications: Secondary | ICD-10-CM | POA: Diagnosis not present

## 2022-12-02 DIAGNOSIS — E559 Vitamin D deficiency, unspecified: Secondary | ICD-10-CM | POA: Diagnosis not present

## 2022-12-02 DIAGNOSIS — E049 Nontoxic goiter, unspecified: Secondary | ICD-10-CM | POA: Diagnosis not present

## 2022-12-02 DIAGNOSIS — Z1212 Encounter for screening for malignant neoplasm of rectum: Secondary | ICD-10-CM | POA: Diagnosis not present

## 2022-12-02 DIAGNOSIS — E785 Hyperlipidemia, unspecified: Secondary | ICD-10-CM | POA: Diagnosis not present

## 2022-12-10 DIAGNOSIS — E049 Nontoxic goiter, unspecified: Secondary | ICD-10-CM | POA: Diagnosis not present

## 2022-12-10 DIAGNOSIS — R82998 Other abnormal findings in urine: Secondary | ICD-10-CM | POA: Diagnosis not present

## 2022-12-10 DIAGNOSIS — Z1339 Encounter for screening examination for other mental health and behavioral disorders: Secondary | ICD-10-CM | POA: Diagnosis not present

## 2022-12-10 DIAGNOSIS — M722 Plantar fascial fibromatosis: Secondary | ICD-10-CM | POA: Diagnosis not present

## 2022-12-10 DIAGNOSIS — J45909 Unspecified asthma, uncomplicated: Secondary | ICD-10-CM | POA: Diagnosis not present

## 2022-12-10 DIAGNOSIS — K219 Gastro-esophageal reflux disease without esophagitis: Secondary | ICD-10-CM | POA: Diagnosis not present

## 2022-12-10 DIAGNOSIS — H409 Unspecified glaucoma: Secondary | ICD-10-CM | POA: Diagnosis not present

## 2022-12-10 DIAGNOSIS — E109 Type 1 diabetes mellitus without complications: Secondary | ICD-10-CM | POA: Diagnosis not present

## 2022-12-10 DIAGNOSIS — E785 Hyperlipidemia, unspecified: Secondary | ICD-10-CM | POA: Diagnosis not present

## 2022-12-10 DIAGNOSIS — E669 Obesity, unspecified: Secondary | ICD-10-CM | POA: Diagnosis not present

## 2022-12-10 DIAGNOSIS — Z0000 Encounter for general adult medical examination without abnormal findings: Secondary | ICD-10-CM | POA: Diagnosis not present

## 2022-12-10 DIAGNOSIS — Z1331 Encounter for screening for depression: Secondary | ICD-10-CM | POA: Diagnosis not present

## 2022-12-10 DIAGNOSIS — F79 Unspecified intellectual disabilities: Secondary | ICD-10-CM | POA: Diagnosis not present

## 2023-04-14 DIAGNOSIS — Q141 Congenital malformation of retina: Secondary | ICD-10-CM | POA: Diagnosis not present

## 2023-04-14 DIAGNOSIS — H532 Diplopia: Secondary | ICD-10-CM | POA: Diagnosis not present

## 2023-04-14 DIAGNOSIS — H04123 Dry eye syndrome of bilateral lacrimal glands: Secondary | ICD-10-CM | POA: Diagnosis not present

## 2023-04-14 DIAGNOSIS — E109 Type 1 diabetes mellitus without complications: Secondary | ICD-10-CM | POA: Diagnosis not present

## 2023-04-16 DIAGNOSIS — Z794 Long term (current) use of insulin: Secondary | ICD-10-CM | POA: Diagnosis not present

## 2023-04-16 DIAGNOSIS — F79 Unspecified intellectual disabilities: Secondary | ICD-10-CM | POA: Diagnosis not present

## 2023-04-16 DIAGNOSIS — E669 Obesity, unspecified: Secondary | ICD-10-CM | POA: Diagnosis not present

## 2023-04-16 DIAGNOSIS — E109 Type 1 diabetes mellitus without complications: Secondary | ICD-10-CM | POA: Diagnosis not present

## 2023-04-16 DIAGNOSIS — E559 Vitamin D deficiency, unspecified: Secondary | ICD-10-CM | POA: Diagnosis not present

## 2023-04-16 DIAGNOSIS — J45909 Unspecified asthma, uncomplicated: Secondary | ICD-10-CM | POA: Diagnosis not present

## 2023-04-16 DIAGNOSIS — E785 Hyperlipidemia, unspecified: Secondary | ICD-10-CM | POA: Diagnosis not present

## 2023-04-16 DIAGNOSIS — H409 Unspecified glaucoma: Secondary | ICD-10-CM | POA: Diagnosis not present

## 2023-04-16 DIAGNOSIS — G5601 Carpal tunnel syndrome, right upper limb: Secondary | ICD-10-CM | POA: Diagnosis not present

## 2023-04-16 DIAGNOSIS — M722 Plantar fascial fibromatosis: Secondary | ICD-10-CM | POA: Diagnosis not present

## 2023-04-16 DIAGNOSIS — Z23 Encounter for immunization: Secondary | ICD-10-CM | POA: Diagnosis not present

## 2023-04-16 DIAGNOSIS — E049 Nontoxic goiter, unspecified: Secondary | ICD-10-CM | POA: Diagnosis not present

## 2023-05-13 DIAGNOSIS — H5010 Unspecified exotropia: Secondary | ICD-10-CM | POA: Diagnosis not present

## 2023-05-13 DIAGNOSIS — H532 Diplopia: Secondary | ICD-10-CM | POA: Diagnosis not present

## 2023-05-18 ENCOUNTER — Other Ambulatory Visit: Payer: Self-pay | Admitting: Ophthalmology

## 2023-05-18 DIAGNOSIS — H532 Diplopia: Secondary | ICD-10-CM

## 2023-06-06 DIAGNOSIS — H532 Diplopia: Secondary | ICD-10-CM

## 2023-06-06 DIAGNOSIS — R930 Abnormal findings on diagnostic imaging of skull and head, not elsewhere classified: Secondary | ICD-10-CM | POA: Diagnosis not present

## 2023-06-18 DIAGNOSIS — H532 Diplopia: Secondary | ICD-10-CM | POA: Diagnosis not present

## 2023-06-18 DIAGNOSIS — H5010 Unspecified exotropia: Secondary | ICD-10-CM | POA: Diagnosis not present

## 2023-08-17 DIAGNOSIS — F79 Unspecified intellectual disabilities: Secondary | ICD-10-CM | POA: Diagnosis not present

## 2023-08-17 DIAGNOSIS — H532 Diplopia: Secondary | ICD-10-CM | POA: Diagnosis not present

## 2023-08-17 DIAGNOSIS — E785 Hyperlipidemia, unspecified: Secondary | ICD-10-CM | POA: Diagnosis not present

## 2023-08-17 DIAGNOSIS — H409 Unspecified glaucoma: Secondary | ICD-10-CM | POA: Diagnosis not present

## 2023-08-17 DIAGNOSIS — J45909 Unspecified asthma, uncomplicated: Secondary | ICD-10-CM | POA: Diagnosis not present

## 2023-08-17 DIAGNOSIS — E559 Vitamin D deficiency, unspecified: Secondary | ICD-10-CM | POA: Diagnosis not present

## 2023-08-17 DIAGNOSIS — L509 Urticaria, unspecified: Secondary | ICD-10-CM | POA: Diagnosis not present

## 2023-08-17 DIAGNOSIS — G5601 Carpal tunnel syndrome, right upper limb: Secondary | ICD-10-CM | POA: Diagnosis not present

## 2023-08-17 DIAGNOSIS — E049 Nontoxic goiter, unspecified: Secondary | ICD-10-CM | POA: Diagnosis not present

## 2023-08-17 DIAGNOSIS — E669 Obesity, unspecified: Secondary | ICD-10-CM | POA: Diagnosis not present

## 2023-08-17 DIAGNOSIS — E109 Type 1 diabetes mellitus without complications: Secondary | ICD-10-CM | POA: Diagnosis not present

## 2023-08-17 DIAGNOSIS — M722 Plantar fascial fibromatosis: Secondary | ICD-10-CM | POA: Diagnosis not present

## 2023-08-18 DIAGNOSIS — H5052 Exophoria: Secondary | ICD-10-CM | POA: Diagnosis not present

## 2023-08-18 DIAGNOSIS — H532 Diplopia: Secondary | ICD-10-CM | POA: Diagnosis not present

## 2023-08-27 DIAGNOSIS — H5203 Hypermetropia, bilateral: Secondary | ICD-10-CM | POA: Diagnosis not present

## 2023-08-27 DIAGNOSIS — E109 Type 1 diabetes mellitus without complications: Secondary | ICD-10-CM | POA: Diagnosis not present

## 2023-08-27 DIAGNOSIS — H25813 Combined forms of age-related cataract, bilateral: Secondary | ICD-10-CM | POA: Diagnosis not present

## 2023-08-27 DIAGNOSIS — H532 Diplopia: Secondary | ICD-10-CM | POA: Diagnosis not present

## 2023-08-27 DIAGNOSIS — H35362 Drusen (degenerative) of macula, left eye: Secondary | ICD-10-CM | POA: Diagnosis not present

## 2023-08-27 DIAGNOSIS — H5010 Unspecified exotropia: Secondary | ICD-10-CM | POA: Diagnosis not present

## 2023-08-27 DIAGNOSIS — Q141 Congenital malformation of retina: Secondary | ICD-10-CM | POA: Diagnosis not present

## 2023-08-27 DIAGNOSIS — H40033 Anatomical narrow angle, bilateral: Secondary | ICD-10-CM | POA: Diagnosis not present

## 2023-10-13 DIAGNOSIS — H2511 Age-related nuclear cataract, right eye: Secondary | ICD-10-CM | POA: Diagnosis not present

## 2023-10-13 DIAGNOSIS — H268 Other specified cataract: Secondary | ICD-10-CM | POA: Diagnosis not present

## 2023-10-13 DIAGNOSIS — H25813 Combined forms of age-related cataract, bilateral: Secondary | ICD-10-CM | POA: Diagnosis not present

## 2023-10-21 DIAGNOSIS — H25812 Combined forms of age-related cataract, left eye: Secondary | ICD-10-CM | POA: Diagnosis not present

## 2023-10-27 DIAGNOSIS — H25812 Combined forms of age-related cataract, left eye: Secondary | ICD-10-CM | POA: Diagnosis not present

## 2023-10-27 DIAGNOSIS — H2512 Age-related nuclear cataract, left eye: Secondary | ICD-10-CM | POA: Diagnosis not present

## 2023-10-27 DIAGNOSIS — H268 Other specified cataract: Secondary | ICD-10-CM | POA: Diagnosis not present

## 2023-12-07 DIAGNOSIS — E559 Vitamin D deficiency, unspecified: Secondary | ICD-10-CM | POA: Diagnosis not present

## 2023-12-07 DIAGNOSIS — E049 Nontoxic goiter, unspecified: Secondary | ICD-10-CM | POA: Diagnosis not present

## 2023-12-07 DIAGNOSIS — E785 Hyperlipidemia, unspecified: Secondary | ICD-10-CM | POA: Diagnosis not present

## 2023-12-07 DIAGNOSIS — Z1212 Encounter for screening for malignant neoplasm of rectum: Secondary | ICD-10-CM | POA: Diagnosis not present

## 2023-12-07 DIAGNOSIS — E109 Type 1 diabetes mellitus without complications: Secondary | ICD-10-CM | POA: Diagnosis not present

## 2023-12-14 DIAGNOSIS — H409 Unspecified glaucoma: Secondary | ICD-10-CM | POA: Diagnosis not present

## 2023-12-14 DIAGNOSIS — H532 Diplopia: Secondary | ICD-10-CM | POA: Diagnosis not present

## 2023-12-14 DIAGNOSIS — R82998 Other abnormal findings in urine: Secondary | ICD-10-CM | POA: Diagnosis not present

## 2023-12-14 DIAGNOSIS — E049 Nontoxic goiter, unspecified: Secondary | ICD-10-CM | POA: Diagnosis not present

## 2023-12-14 DIAGNOSIS — E109 Type 1 diabetes mellitus without complications: Secondary | ICD-10-CM | POA: Diagnosis not present

## 2023-12-14 DIAGNOSIS — Z1331 Encounter for screening for depression: Secondary | ICD-10-CM | POA: Diagnosis not present

## 2023-12-14 DIAGNOSIS — E669 Obesity, unspecified: Secondary | ICD-10-CM | POA: Diagnosis not present

## 2023-12-14 DIAGNOSIS — E785 Hyperlipidemia, unspecified: Secondary | ICD-10-CM | POA: Diagnosis not present

## 2023-12-14 DIAGNOSIS — J45909 Unspecified asthma, uncomplicated: Secondary | ICD-10-CM | POA: Diagnosis not present

## 2023-12-14 DIAGNOSIS — F79 Unspecified intellectual disabilities: Secondary | ICD-10-CM | POA: Diagnosis not present

## 2023-12-14 DIAGNOSIS — G5601 Carpal tunnel syndrome, right upper limb: Secondary | ICD-10-CM | POA: Diagnosis not present

## 2023-12-14 DIAGNOSIS — E559 Vitamin D deficiency, unspecified: Secondary | ICD-10-CM | POA: Diagnosis not present

## 2023-12-14 DIAGNOSIS — Z0000 Encounter for general adult medical examination without abnormal findings: Secondary | ICD-10-CM | POA: Diagnosis not present

## 2024-01-01 DIAGNOSIS — R319 Hematuria, unspecified: Secondary | ICD-10-CM | POA: Diagnosis not present

## 2024-01-01 DIAGNOSIS — N390 Urinary tract infection, site not specified: Secondary | ICD-10-CM | POA: Diagnosis not present

## 2024-03-22 DIAGNOSIS — Z961 Presence of intraocular lens: Secondary | ICD-10-CM | POA: Diagnosis not present

## 2024-03-22 DIAGNOSIS — H52223 Regular astigmatism, bilateral: Secondary | ICD-10-CM | POA: Diagnosis not present

## 2024-03-22 DIAGNOSIS — H5213 Myopia, bilateral: Secondary | ICD-10-CM | POA: Diagnosis not present

## 2024-03-22 DIAGNOSIS — H524 Presbyopia: Secondary | ICD-10-CM | POA: Diagnosis not present

## 2024-03-22 DIAGNOSIS — H532 Diplopia: Secondary | ICD-10-CM | POA: Diagnosis not present

## 2024-03-29 DIAGNOSIS — E785 Hyperlipidemia, unspecified: Secondary | ICD-10-CM | POA: Diagnosis not present

## 2024-03-29 DIAGNOSIS — E559 Vitamin D deficiency, unspecified: Secondary | ICD-10-CM | POA: Diagnosis not present

## 2024-03-29 DIAGNOSIS — G5601 Carpal tunnel syndrome, right upper limb: Secondary | ICD-10-CM | POA: Diagnosis not present

## 2024-03-29 DIAGNOSIS — E049 Nontoxic goiter, unspecified: Secondary | ICD-10-CM | POA: Diagnosis not present

## 2024-03-29 DIAGNOSIS — Z23 Encounter for immunization: Secondary | ICD-10-CM | POA: Diagnosis not present

## 2024-03-29 DIAGNOSIS — E669 Obesity, unspecified: Secondary | ICD-10-CM | POA: Diagnosis not present

## 2024-03-29 DIAGNOSIS — J45909 Unspecified asthma, uncomplicated: Secondary | ICD-10-CM | POA: Diagnosis not present

## 2024-03-29 DIAGNOSIS — H409 Unspecified glaucoma: Secondary | ICD-10-CM | POA: Diagnosis not present

## 2024-03-29 DIAGNOSIS — M722 Plantar fascial fibromatosis: Secondary | ICD-10-CM | POA: Diagnosis not present

## 2024-03-29 DIAGNOSIS — F79 Unspecified intellectual disabilities: Secondary | ICD-10-CM | POA: Diagnosis not present

## 2024-03-29 DIAGNOSIS — E109 Type 1 diabetes mellitus without complications: Secondary | ICD-10-CM | POA: Diagnosis not present

## 2024-03-29 DIAGNOSIS — H532 Diplopia: Secondary | ICD-10-CM | POA: Diagnosis not present
